# Patient Record
Sex: Male | Born: 1960 | Race: White | Hispanic: No | Marital: Married | Smoking: Never smoker
Health system: Southern US, Community
[De-identification: ages and names within clinical notes are randomized; demographics above are authoritative.]

## PROBLEM LIST (undated history)

## (undated) DIAGNOSIS — C61 Malignant neoplasm of prostate: Secondary | ICD-10-CM

## (undated) HISTORY — PX: TONSILLECTOMY: SUR1361

## (undated) HISTORY — PX: PROSTATE BIOPSY: SHX241

---

## 2015-07-28 DIAGNOSIS — M654 Radial styloid tenosynovitis [de Quervain]: Secondary | ICD-10-CM | POA: Diagnosis not present

## 2015-07-28 DIAGNOSIS — Z86018 Personal history of other benign neoplasm: Secondary | ICD-10-CM | POA: Diagnosis not present

## 2015-07-28 DIAGNOSIS — D485 Neoplasm of uncertain behavior of skin: Secondary | ICD-10-CM | POA: Diagnosis not present

## 2015-07-28 DIAGNOSIS — M25532 Pain in left wrist: Secondary | ICD-10-CM | POA: Diagnosis not present

## 2015-07-28 DIAGNOSIS — D225 Melanocytic nevi of trunk: Secondary | ICD-10-CM | POA: Diagnosis not present

## 2015-07-28 DIAGNOSIS — L719 Rosacea, unspecified: Secondary | ICD-10-CM | POA: Diagnosis not present

## 2016-03-07 DIAGNOSIS — E785 Hyperlipidemia, unspecified: Secondary | ICD-10-CM | POA: Diagnosis not present

## 2016-03-07 DIAGNOSIS — Z131 Encounter for screening for diabetes mellitus: Secondary | ICD-10-CM | POA: Diagnosis not present

## 2016-03-07 DIAGNOSIS — Z0000 Encounter for general adult medical examination without abnormal findings: Secondary | ICD-10-CM | POA: Diagnosis not present

## 2017-03-13 DIAGNOSIS — Z0000 Encounter for general adult medical examination without abnormal findings: Secondary | ICD-10-CM | POA: Diagnosis not present

## 2017-03-13 DIAGNOSIS — E785 Hyperlipidemia, unspecified: Secondary | ICD-10-CM | POA: Diagnosis not present

## 2017-03-13 DIAGNOSIS — Z1211 Encounter for screening for malignant neoplasm of colon: Secondary | ICD-10-CM | POA: Diagnosis not present

## 2017-03-13 DIAGNOSIS — R209 Unspecified disturbances of skin sensation: Secondary | ICD-10-CM | POA: Diagnosis not present

## 2017-03-13 DIAGNOSIS — E669 Obesity, unspecified: Secondary | ICD-10-CM | POA: Diagnosis not present

## 2017-04-03 DIAGNOSIS — D171 Benign lipomatous neoplasm of skin and subcutaneous tissue of trunk: Secondary | ICD-10-CM | POA: Diagnosis not present

## 2017-04-10 DIAGNOSIS — N402 Nodular prostate without lower urinary tract symptoms: Secondary | ICD-10-CM | POA: Diagnosis not present

## 2017-04-10 DIAGNOSIS — R972 Elevated prostate specific antigen [PSA]: Secondary | ICD-10-CM | POA: Diagnosis not present

## 2017-04-24 DIAGNOSIS — R972 Elevated prostate specific antigen [PSA]: Secondary | ICD-10-CM | POA: Diagnosis not present

## 2017-04-29 ENCOUNTER — Other Ambulatory Visit: Payer: Self-pay | Admitting: Urology

## 2017-04-29 DIAGNOSIS — C61 Malignant neoplasm of prostate: Secondary | ICD-10-CM

## 2017-05-01 DIAGNOSIS — N402 Nodular prostate without lower urinary tract symptoms: Secondary | ICD-10-CM | POA: Diagnosis not present

## 2017-05-01 DIAGNOSIS — R972 Elevated prostate specific antigen [PSA]: Secondary | ICD-10-CM | POA: Diagnosis not present

## 2017-05-07 DIAGNOSIS — C61 Malignant neoplasm of prostate: Secondary | ICD-10-CM | POA: Diagnosis not present

## 2017-05-08 ENCOUNTER — Encounter: Payer: Self-pay | Admitting: Radiation Oncology

## 2017-05-13 ENCOUNTER — Encounter (HOSPITAL_COMMUNITY)
Admission: RE | Admit: 2017-05-13 | Discharge: 2017-05-13 | Disposition: A | Discharge status: Home / Self Care | Payer: BLUE CROSS/BLUE SHIELD | Source: Ambulatory Visit | Attending: Urology | Admitting: Urology

## 2017-05-13 DIAGNOSIS — C61 Malignant neoplasm of prostate: Secondary | ICD-10-CM | POA: Insufficient documentation

## 2017-05-13 MED ORDER — TECHNETIUM TC 99M MEDRONATE IV KIT
22.0000 | PACK | Freq: Once | INTRAVENOUS | Status: AC | PRN
  Administered 2017-05-13: 22 via INTRAVENOUS

## 2017-05-20 ENCOUNTER — Ambulatory Visit: Payer: BLUE CROSS/BLUE SHIELD

## 2017-05-20 ENCOUNTER — Ambulatory Visit: Payer: BLUE CROSS/BLUE SHIELD | Admitting: Radiation Oncology

## 2017-05-22 ENCOUNTER — Encounter: Payer: Self-pay | Admitting: Radiation Oncology

## 2017-05-22 NOTE — Progress Notes (Signed)
GU Location of Tumor / Histology: prostatic adenocarcinoma  If Prostate Cancer, Gleason Score is (4 + 3) and PSA is (44.8). Prostate volume: 32 cc.  Barry Nash was referred by Dr. Sheryn Bison (PCP, [facility]) to Dr. Jeffie Pollock for further evaluation of an elevated PSA. Dr. Jeffie Pollock referred the patient to Dr. Alinda Money to discuss surgical options.  Biopsies of prostate (if applicable) revealed:    Past/Anticipated interventions by urology, if any: prostate biopsy, CT scan of abd/pelvis (negative), bone scan (Focal increased radiotracer uptake posterior aspect right eighth rib, right aspect L3 vertebra and tiny area of increased uptake right femoral neck. No comparison exams to determine if these areas represent changes related to degenerative disease/prior fracture.Small metastatic foci therefore cannot be excluded.), referral to radiation oncology  Past/Anticipated interventions by medical oncology, if any: no  Weight changes, if any: no  Bowel/Bladder complaints, if any:  IPSS 1. Denies dysuria, hematuria, leakage or incontinence.   Nausea/Vomiting, if any: no  Pain issues, if any:  no  SAFETY ISSUES:  Prior radiation? no  Pacemaker/ICD? no  Possible current pregnancy? no  Is the patient on methotrexate? no  Current Complaints / other details:  57 year old male. Married with one daughter and one son.

## 2017-05-23 ENCOUNTER — Encounter: Payer: Self-pay | Admitting: Radiation Oncology

## 2017-05-23 ENCOUNTER — Other Ambulatory Visit: Payer: Self-pay

## 2017-05-23 ENCOUNTER — Ambulatory Visit
Admission: RE | Admit: 2017-05-23 | Discharge: 2017-05-23 | Disposition: A | Discharge status: Home / Self Care | Payer: BLUE CROSS/BLUE SHIELD | Source: Ambulatory Visit | Attending: Radiation Oncology | Admitting: Radiation Oncology

## 2017-05-23 ENCOUNTER — Other Ambulatory Visit: Payer: Self-pay | Admitting: Urology

## 2017-05-23 ENCOUNTER — Encounter: Payer: Self-pay | Admitting: Medical Oncology

## 2017-05-23 VITALS — BP 119/84 | HR 75 | Temp 98.0°F | Resp 18 | Ht 73.0 in | Wt 227.6 lb

## 2017-05-23 DIAGNOSIS — Z8042 Family history of malignant neoplasm of prostate: Secondary | ICD-10-CM | POA: Diagnosis not present

## 2017-05-23 DIAGNOSIS — R972 Elevated prostate specific antigen [PSA]: Secondary | ICD-10-CM | POA: Diagnosis not present

## 2017-05-23 DIAGNOSIS — C61 Malignant neoplasm of prostate: Secondary | ICD-10-CM | POA: Diagnosis not present

## 2017-05-23 HISTORY — DX: Malignant neoplasm of prostate: C61

## 2017-05-23 NOTE — Progress Notes (Signed)
See progress note under physician encounter. 

## 2017-05-23 NOTE — Progress Notes (Signed)
Introduced myself to Barry Nash and his wife as the prostate nurse navigator and my role. After his consult with Dr. Alinda Money, he learned he may need multimodality therapy including surgery and radiation. They both are very anxious and state he has always been healthy. This came as a huge shock. I gave them my business card and asked them to call me with questions or concerns. They voiced understanding.

## 2017-05-23 NOTE — Progress Notes (Signed)
Radiation Oncology         [phone] ________________________________  Initial Outpatient Consultation  Name: Barry Nash MRN: [id number]  Date: 05/23/2017  DOB: March 19, 1961  CC:Barry Dials, MD  Barry Bring, MD   REFERRING PHYSICIAN: Raynelle Bring, MD  DIAGNOSIS: 57 y.o. gentleman with Stage T2a adenocarcinoma of the prostate with Gleason Score of 4+3, and PSA of 44.8.    ICD-10-CM   1. Malignant neoplasm of prostate ([place]) C61     HISTORY OF PRESENT ILLNESS: Barry Nash is a 57 y.o. male with a diagnosis of prostate cancer. He was noted to have an elevated PSA of 39.8 by his primary care physician, Dr. Sheryn Nash.  Accordingly, he was referred for evaluation in urology by Dr. Jeffie Nash in January 2019 and a digital rectal examination was performed at that time revealing a 2 cm right base nodule. A repeat PSA on 03/29/17 was measured to be 44.8. The patient proceeded to transrectal ultrasound with 12 biopsies of the prostate on 04/24/17. The prostate volume measured 32 cc.  Out of 12 core biopsies, 9 were positive.  The maximum Gleason score was 4+3, and this was seen in all six cores on the the right.  Additionally, there was Gleason 3+3 disease in 3 of the 6 cores on the left.  He had a CT pelvis and bone scan for disease staging.  CT pelvis on 05/01/2017 was negative for any obvious metastatic disease.  Whole body bone scan on 05/13/17 showed focal increased radiotracer uptake to the posterior aspect of the right eighth rib, right aspect L3 vertebra, and a tiny area of increased uptake to the right femoral neck- non-specific and suspected to be degenerative.    The patient reviewed the biopsy results with his urologist. He has kindly been referred today for discussion of potential radiation treatment options. He met with Dr. Alinda Nash on 05/07/2017 to discuss robot-assisted laparoscopic prostatectomy.  At this time, he is leaning towards proceeding with prostatectomy.  He is accompanied  today by his wife.  PREVIOUS RADIATION THERAPY: No  PAST MEDICAL HISTORY:  Past Medical History:  Diagnosis Date  . Prostate cancer ([place])       PAST SURGICAL HISTORY: Past Surgical History:  Procedure Laterality Date  . PROSTATE BIOPSY    . TONSILLECTOMY      FAMILY HISTORY:  Family History  Problem Relation Age of Onset  . Diabetes Father   . Heart disease Father   . Benign prostatic hyperplasia Father   . Cancer Paternal Grandfather        prostate cancer    SOCIAL HISTORY:  Social History   Socioeconomic History  . Marital status: Married    Spouse name: Not on file  . Number of children: Not on file  . Years of education: Not on file  . Highest education level: Not on file  Social Needs  . Financial resource strain: Not on file  . Food insecurity - worry: Not on file  . Food insecurity - inability: Not on file  . Transportation needs - medical: Not on file  . Transportation needs - non-medical: Not on file  Occupational History  . Not on file  Tobacco Use  . Smoking status: Never Smoker  . Smokeless tobacco: Never Used  Substance and Sexual Activity  . Alcohol use: Yes    Frequency: Never    Comment: occasionally  . Drug use: No  . Sexual activity: Yes  Other Topics Concern  . Not on file  Social  History Narrative   Resides in [place]. Two children: son (30) and daughter (74).     ALLERGIES: Penicillins  MEDICATIONS:  Current Outpatient Medications  Medication Sig Dispense Refill  . Cholecalciferol (VITAMIN D3 PO) Take by mouth. Reports taking 4 2000 IU of D3 per day for total of 8000    . Multiple Vitamins-Minerals (WHOLE FOOD MULTIVITAMIN PO) Take by mouth.    . NON FORMULARY Circumin    . Omega-3 Fatty Acids (FISH OIL) 1000 MG CAPS Take by mouth.    . Probiotic Product (PROBIOTIC-10 PO) Take by mouth.     No current facility-administered medications for this encounter.     REVIEW OF SYSTEMS:  On review of systems, the patient reports  that he is doing well overall. He denies any chest pain, shortness of breath, cough, fevers, chills, night sweats, unintended weight changes. He denies any bowel disturbances, and denies abdominal pain, nausea or vomiting. He denies any new musculoskeletal or joint aches or pains. His IPSS was 1, indicating mild urinary symptoms. He is able to complete sexual activity with most attempts. A complete review of systems is obtained and is otherwise negative.  PHYSICAL EXAM:  Wt Readings from Last 3 Encounters:  05/23/17 227 lb 9.6 oz (103.2 kg)   Temp Readings from Last 3 Encounters:  05/23/17 98 F (36.7 C) (Oral)   BP Readings from Last 3 Encounters:  05/23/17 119/84   Pulse Readings from Last 3 Encounters:  05/23/17 75   Pain Assessment Pain Score: 0-No pain/10  In general this is a well appearing caucasian gentleman in no acute distress. He is alert and oriented x4 and appropriate throughout the examination. HEENT reveals that the patient is normocephalic, atraumatic. EOMs are intact. PERRLA. Skin is intact without any evidence of gross lesions. Cardiovascular exam reveals a regular rate and rhythm, no clicks rubs or murmurs are auscultated. Chest is clear to auscultation bilaterally. Lymphatic assessment is performed and does not reveal any adenopathy in the cervical, supraclavicular, axillary, or inguinal chains. Abdomen has active bowel sounds in all quadrants and is intact. The abdomen is soft, non tender, non distended. Lower extremities are negative for pretibial pitting edema, deep calf tenderness, cyanosis or clubbing.   KPS = 100  100 - Normal; no complaints; no evidence of disease. 90   - Able to carry on normal activity; minor signs or symptoms of disease. 80   - Normal activity with effort; some signs or symptoms of disease. 24   - Cares for self; unable to carry on normal activity or to do active work. 60   - Requires occasional assistance, but is able to care for most of his  personal needs. 50   - Requires considerable assistance and frequent medical care. 40   - Disabled; requires special care and assistance. 65   - Severely disabled; hospital admission is indicated although death not imminent. 23   - Very sick; hospital admission necessary; active supportive treatment necessary. 10   - Moribund; fatal processes progressing rapidly. 0     - Dead  Karnofsky DA, Abelmann WH, Craver LS and Burchenal JH [phone]) The use of the nitrogen mustards in the palliative treatment of carcinoma: with particular reference to bronchogenic carcinoma Cancer 1 [id number]  LABORATORY DATA:  No results found for: WBC, HGB, HCT, MCV, PLT No results found for: NA, K, CL, CO2 No results found for: ALT, AST, GGT, ALKPHOS, BILITOT   RADIOGRAPHY: Nm Bone Scan Whole Body  Result Date: 05/14/2017  CLINICAL DATA:  57 year old male with prostate cancer with elevated PSA. No history of fracture or recent trauma. Initial encounter. EXAM: NUCLEAR MEDICINE WHOLE BODY BONE SCAN TECHNIQUE: Whole body anterior and posterior images were obtained approximately 3 hours after intravenous injection of radiopharmaceutical. RADIOPHARMACEUTICALS:  22.0 mCi Technetium-66mMDP IV COMPARISON:  None. FINDINGS: Focal increased radiotracer uptake posterior aspect right eighth rib. Increased uptake right aspect L3. Tiny area of increased uptake right femoral neck. No comparison exams to determine if these areas represent changes related to degenerative disease/prior fracture. Small metastatic foci cannot be excluded. Slight asymmetric radiotracer uptake superolateral aspect of the orbital region greater on the left possibly related to slight head rotation. Slight increased uptake anterior aspect left femoral neck region may represent overlying structures. Increased uptake patellar region, feet, shoulders and sternoclavicular region bilaterally region probably related to degenerative changes. Linear increased uptake within the  tibia bilaterally can be seen with hypertrophic osteoarthropathy. Both kidneys visualized. IMPRESSION: Focal increased radiotracer uptake posterior aspect right eighth rib, right aspect L3 vertebra and tiny area of increased uptake right femoral neck. No comparison exams to determine if these areas represent changes related to degenerative disease/prior fracture. Small metastatic foci therefore cannot be excluded. Linear increased uptake of the tibia bilaterally as can be seen with hypertrophic osteoarthropathy. Electronically Signed   By: SGenia DelM.D.   On: 05/14/2017 07:36      IMPRESSION/PLAN: 1. 57y.o. gentleman with Stage T2a adenocarcinoma of the prostate with Gleason Score of 4+3, and PSA of 44.8.  Today, we reviewed the findings and workup thus far.  We discussed the natural history of prostate cancer.  We reviewed the the implications of T-stage, Gleason's Score, and PSA on decision-making and outcomes in prostate cancer.  We discussed radiation treatment in the management of prostate cancer with regard to the logistics and delivery of external beam radiation treatment as well as the logistics and delivery of prostate brachytherapy.  We compared and contrasted each of these approaches and also compared these against prostatectomy.  Given his significantly elevated PSA, placing him in the high risk disease category, if he chose to proceed with definitive radiotherapy, our recommendation would be for a combination of long-term ADT, beginning approximately 2 months prior to radiotherapy. He would be a candidate for either 8 weeks of external beam radiotherapy or 5 weeks of external beam radiotherapy with brachytherapy seed boost.  He has met with Dr. BAlinda Moneyto discuss robot-assisted laparoscopic radical prostatectomy and at this point, he is leaning towards proceeding with surgery.  We agree that he appears to be a good surgical candidate and that this would be a good treatment option for him.  We  discussed the potential role of radiotherapy in the post-operative setting should he have negative pathology findings that would suggest an increased risk of residual disease or recurrence or a detectable/rising PSA post prostatectomy.  He seems to have a good understanding of his disease and our recommendations.  At the conclusion of our conversation, the patient would like to proceed with prosatectomy.  We will share our findings with Dr. BAlinda Moneyand Dr. WJeffie Pollockand the patient will move forward with scheduling surgery.  We would be happy to continue to participate in his care should this be indicated in the future.  We enjoyed meeting with him and his wife today.  We spent 60 minutes minutes face to face with the patient and more than 50% of that time was spent in counseling and/or coordination of  care.   Nicholos Johns, PA-C    Tyler Pita, MD  [facility] Direct Dial: [phone]  Fax: [phone] [email or web address]  Skype  [email or web address]  This document serves as a record of services personally performed by Tyler Pita, MD and Freeman Caldron, PA-C. It was created on their behalf by Bethann Humble, a trained medical scribe. The creation of this record is based on the scribe's personal observations and the provider's statements to them. This document has been checked and approved by the attending provider.

## 2017-05-27 DIAGNOSIS — M6281 Muscle weakness (generalized): Secondary | ICD-10-CM | POA: Diagnosis not present

## 2017-05-27 DIAGNOSIS — C61 Malignant neoplasm of prostate: Secondary | ICD-10-CM | POA: Diagnosis not present

## 2017-06-07 DIAGNOSIS — M6281 Muscle weakness (generalized): Secondary | ICD-10-CM | POA: Diagnosis not present

## 2017-06-07 DIAGNOSIS — M62838 Other muscle spasm: Secondary | ICD-10-CM | POA: Diagnosis not present

## 2017-06-07 DIAGNOSIS — N393 Stress incontinence (female) (male): Secondary | ICD-10-CM | POA: Diagnosis not present

## 2017-06-14 NOTE — Patient Instructions (Addendum)
Barry Nash  06/14/2017   Your procedure is scheduled on: 06-24-17    Report to [facility] Main  Entrance at 530 AM. Have a seat in the Main Lobby. Please note there is a phone at the [employer]. Please call [phone] on that phone. Someone from Short Stay will come and get you from the Main Lobby and take you to Short Stay.    Call this number if you have problems the morning of surgery [phone]   Remember: Do not eat food or drink liquids :After Midnight.     Take these medicines the morning of surgery with A SIP OF WATER: None                                You may not have any metal on your body including hair pins and              piercings  Do not wear jewelry, lotions, powders or deodorant             Men may shave face and neck.   Do not bring valuables to the hospital. Barry Nash.  Contacts, dentures or bridgework may not be worn into surgery.  Leave suitcase in the car. After surgery it may be brought to your room.                 Please read over the following fact sheets you were given: _____________________________________________________________________         [facility] - Preparing for Surgery Before surgery, you can play an important role.  Because skin is not sterile, your skin needs to be as free of germs as possible.  You can reduce the number of germs on your skin by washing with CHG (chlorahexidine gluconate) soap before surgery.  CHG is an antiseptic cleaner which kills germs and bonds with the skin to continue killing germs even after washing. Please DO NOT use if you have an allergy to CHG or antibacterial soaps.  If your skin becomes reddened/irritated stop using the CHG and inform your nurse when you arrive at Short Stay. Do not shave (including legs and underarms) for at least 48 hours prior to the first CHG shower.  You may shave your face/neck. Please follow  these instructions carefully:  1.  Shower with CHG Soap the night before surgery and the  morning of Surgery.  2.  If you choose to wash your hair, wash your hair first as usual with your  normal  shampoo.  3.  After you shampoo, rinse your hair and body thoroughly to remove the  shampoo.                           4.  Use CHG as you would any other liquid soap.  You can apply chg directly  to the skin and wash                       Gently with a scrungie or clean washcloth.  5.  Apply the CHG Soap to your body ONLY FROM THE NECK DOWN.   Do not use on face/ open  Wound or open sores. Avoid contact with eyes, ears mouth and genitals (private parts).                       Wash face,  Genitals (private parts) with your normal soap.             6.  Wash thoroughly, paying special attention to the area where your surgery  will be performed.  7.  Thoroughly rinse your body with warm water from the neck down.  8.  DO NOT shower/wash with your normal soap after using and rinsing off  the CHG Soap.                9.  Pat yourself dry with a clean towel.            10.  Wear clean pajamas.            11.  Place clean sheets on your bed the night of your first shower and do not  sleep with pets. Day of Surgery : Do not apply any lotions/deodorants the morning of surgery.  Please wear clean clothes to the hospital/surgery center.  FAILURE TO FOLLOW THESE INSTRUCTIONS MAY RESULT IN THE CANCELLATION OF YOUR SURGERY PATIENT SIGNATURE_________________________________  NURSE SIGNATURE__________________________________  ________________________________________________________________________   Barry Nash  An incentive spirometer is a tool that can help keep your lungs clear and active. This tool measures how well you are filling your lungs with each breath. Taking long deep breaths may help reverse or decrease the chance of developing breathing (pulmonary) problems  (especially infection) following:  A long period of time when you are unable to move or be active. BEFORE THE PROCEDURE   If the spirometer includes an indicator to show your best effort, your nurse or respiratory therapist will set it to a desired goal.  If possible, sit up straight or lean slightly forward. Try not to slouch.  Hold the incentive spirometer in an upright position. INSTRUCTIONS FOR USE  1. Sit on the edge of your bed if possible, or sit up as far as you can in bed or on a chair. 2. Hold the incentive spirometer in an upright position. 3. Breathe out normally. 4. Place the mouthpiece in your mouth and seal your lips tightly around it. 5. Breathe in slowly and as deeply as possible, raising the piston or the ball toward the top of the column. 6. Hold your breath for 3-5 seconds or for as long as possible. Allow the piston or ball to fall to the bottom of the column. 7. Remove the mouthpiece from your mouth and breathe out normally. 8. Rest for a few seconds and repeat Steps 1 through 7 at least 10 times every 1-2 hours when you are awake. Take your time and take a few normal breaths between deep breaths. 9. The spirometer may include an indicator to show your best effort. Use the indicator as a goal to work toward during each repetition. 10. After each set of 10 deep breaths, practice coughing to be sure your lungs are clear. If you have an incision (the cut made at the time of surgery), support your incision when coughing by placing a pillow or rolled up towels firmly against it. Once you are able to get out of bed, walk around indoors and cough well. You may stop using the incentive spirometer when instructed by your caregiver.  RISKS AND COMPLICATIONS  Take your time so you do not get  dizzy or light-headed.  If you are in pain, you may need to take or ask for pain medication before doing incentive spirometry. It is harder to take a deep breath if you are having  pain. AFTER USE  Rest and breathe slowly and easily.  It can be helpful to keep track of a log of your progress. Your caregiver can provide you with a simple table to help with this. If you are using the spirometer at home, follow these instructions: [place] IF:   You are having difficultly using the spirometer.  You have trouble using the spirometer as often as instructed.  Your pain medication is not giving enough relief while using the spirometer.  You develop fever of 100.5 F (38.1 C) or higher. SEEK IMMEDIATE MEDICAL CARE IF:   You cough up bloody sputum that had not been present before.  You develop fever of 102 F (38.9 C) or greater.  You develop worsening pain at or near the incision site. MAKE SURE YOU:   Understand these instructions.  Will watch your condition.  Will get help right away if you are not doing well or get worse. Document Released: 07/23/2006 Document Revised: 06/04/2011 Document Reviewed: 09/23/2006 ExitCare Patient Information 2014 ExitCare, [place].   ________________________________________________________________________  WHAT IS A BLOOD TRANSFUSION? Blood Transfusion Information  A transfusion is the replacement of blood or some of its parts. Blood is made up of multiple cells which provide different functions.  Red blood cells carry oxygen and are used for blood loss replacement.  White blood cells fight against infection.  Platelets control bleeding.  Plasma helps clot blood.  Other blood products are available for specialized needs, such as hemophilia or other clotting disorders. BEFORE THE TRANSFUSION  Who gives blood for transfusions?   Healthy volunteers who are fully evaluated to make sure their blood is safe. This is blood bank blood. Transfusion therapy is the safest it has ever been in the practice of medicine. Before blood is taken from a donor, a complete history is taken to make sure that person has no history  of diseases nor engages in risky social behavior (examples are intravenous drug use or sexual activity with multiple partners). The donor's travel history is screened to minimize risk of transmitting infections, such as malaria. The donated blood is tested for signs of infectious diseases, such as HIV and hepatitis. The blood is then tested to be sure it is compatible with you in order to minimize the chance of a transfusion reaction. If you or a relative donates blood, this is often done in anticipation of surgery and is not appropriate for emergency situations. It takes many days to process the donated blood. RISKS AND COMPLICATIONS Although transfusion therapy is very safe and saves many lives, the main dangers of transfusion include:   Getting an infectious disease.  Developing a transfusion reaction. This is an allergic reaction to something in the blood you were given. Every precaution is taken to prevent this. The decision to have a blood transfusion has been considered carefully by your caregiver before blood is given. Blood is not given unless the benefits outweigh the risks. AFTER THE TRANSFUSION  Right after receiving a blood transfusion, you will usually feel much better and more energetic. This is especially true if your red blood cells have gotten low (anemic). The transfusion raises the level of the red blood cells which carry oxygen, and this usually causes an energy increase.  The nurse administering the transfusion will  monitor you carefully for complications. HOME CARE INSTRUCTIONS  No special instructions are needed after a transfusion. You may find your energy is better. Speak with your caregiver about any limitations on activity for underlying diseases you may have. SEEK MEDICAL CARE IF:   Your condition is not improving after your transfusion.  You develop redness or irritation at the intravenous (IV) site. SEEK IMMEDIATE MEDICAL CARE IF:  Any of the following symptoms  occur over the next 12 hours:  Shaking chills.  You have a temperature by mouth above 102 F (38.9 C), not controlled by medicine.  Chest, back, or muscle pain.  People around you feel you are not acting correctly or are confused.  Shortness of breath or difficulty breathing.  Dizziness and fainting.  You get a rash or develop hives.  You have a decrease in urine output.  Your urine turns a dark color or changes to pink, red, or brown. Any of the following symptoms occur over the next 10 days:  You have a temperature by mouth above 102 F (38.9 C), not controlled by medicine.  Shortness of breath.  Weakness after normal activity.  The white part of the eye turns yellow (jaundice).  You have a decrease in the amount of urine or are urinating less often.  Your urine turns a dark color or changes to pink, red, or brown. Document Released: 03/09/2000 Document Revised: 06/04/2011 Document Reviewed: 10/27/2007 [facility] Patient Information 2014 [place], [place].  _______________________________________________________________________

## 2017-06-17 ENCOUNTER — Encounter (HOSPITAL_COMMUNITY): Payer: Self-pay

## 2017-06-17 ENCOUNTER — Encounter (HOSPITAL_COMMUNITY)
Admission: RE | Admit: 2017-06-17 | Discharge: 2017-06-17 | Disposition: A | Discharge status: Home / Self Care | Payer: BLUE CROSS/BLUE SHIELD | Source: Ambulatory Visit | Attending: Urology | Admitting: Urology

## 2017-06-17 ENCOUNTER — Other Ambulatory Visit: Payer: Self-pay

## 2017-06-17 DIAGNOSIS — C61 Malignant neoplasm of prostate: Secondary | ICD-10-CM | POA: Diagnosis not present

## 2017-06-17 DIAGNOSIS — Z01812 Encounter for preprocedural laboratory examination: Secondary | ICD-10-CM | POA: Diagnosis not present

## 2017-06-17 LAB — BASIC METABOLIC PANEL
ANION GAP: 9 (ref 5–15)
BUN: 16 mg/dL (ref 6–20)
CALCIUM: 8.8 mg/dL — AB (ref 8.9–10.3)
CHLORIDE: 106 mmol/L (ref 101–111)
CO2: 25 mmol/L (ref 22–32)
Creatinine, Ser: 0.93 mg/dL (ref 0.61–1.24)
GFR calc Af Amer: >60 mL/min (ref >60)
GFR calc non Af Amer: >60 mL/min (ref >60)
Glucose, Bld: 88 mg/dL (ref 65–99)
POTASSIUM: 4.4 mmol/L (ref 3.5–5.1)
Sodium: 140 mmol/L (ref 135–145)

## 2017-06-17 LAB — CBC
HEMATOCRIT: 44.8 % (ref 39.0–52.0)
HEMOGLOBIN: 14.7 g/dL (ref 13.0–17.0)
MCH: 27 pg (ref 26.0–34.0)
MCHC: 32.8 g/dL (ref 30.0–36.0)
MCV: 82.2 fL (ref 78.0–100.0)
Platelets: 261 10*3/uL (ref 150–400)
RBC: 5.45 MIL/uL (ref 4.22–5.81)
RDW: 13.6 % (ref 11.5–15.5)
WBC: 6.1 10*3/uL (ref 4.0–10.5)

## 2017-06-17 LAB — ABO/RH: ABO/RH(D): O NEG

## 2017-06-21 NOTE — H&P (Signed)
CC/HPI: CC: Prostate Cancer    Barry Nash is a 57 year old gentleman who was noted to have an elevated PSA of 44.8 and a 2 cm right base prostate nodule prompting a TRUS biopsy of the prostate by Dr. Jeffie Pollock on 04/24/17. This confirmed Gleason 4+3=7 adenocarcinoma of the prostate with 9 out of 12 biopsy cores positive for malignancy.   Family history: None.   Imaging studies:  CT pelvis (05/01/17) - Negative for metastatic disease.   PMH: He has no medical comorbidities.  PSH: No abdominal surgeries.   TNM stage: cT2 vs T3a N0 Mx  PSA: 44.8  Gleason score: 4+3=7  Biopsy (04/24/17): 9/12 cores positive  Left: L lateral mid (20%, 3+3=6), L mid (5%, 3+3=6), L lateral base (5%, 3+3=6)  Right: R apex (80%, 4+3=7), R lateral apex (90%, 4+3=7), R mid (95%, 4+3=7, PNI), R lateral mid (90%, 4+3=7), R base (80%, 4+3=7, PNI), R lateral base (90%, 4+3=7, PNI)  Prostate volume: 32.0 cc   Nomogram  OC disease: 4%  EPE: 95%  SVI: 57%  LNI: 57%  PFS (5 year, 10 year): 12%, 7%   Urinary function: IPSS is 1.  Erectile function: SHIM score is 20. He is able to achieve erections adequate for intercourse without medication.     ALLERGIES: Penicillin - Skin Rash    MEDICATIONS: None   GU PSH: Locm 300-399Mg /Ml Iodine,1Ml - 05/01/2017 Prostate Needle Biopsy - 04/24/2017    NON-GU PSH: Surgical Pathology, Gross And Microscopic Examination For Prostate Needle - 04/24/2017 Tonsillectomy..    GU PMH: Elevated PSA, His right prostate was markedly abnormal and suspicious for a locally advanced prostate cancer. I will notify him of the biopsy results. He will need staging for high risk disease regardless of the grade. I will arrange f/u based on the results. - 04/24/2017, He has a markedly elevated PSA with a large right base nodule and a firm prostate. I am going to repeat a PSA today to assess his PSA kinetics and will get him set up for a biopsy ASAP. I have reviewed the risks of bleeding, infection  and voiding difficulty. Levaquin sent. , - 04/10/2017 Prostate nodule w/o LUTS - 04/10/2017    NON-GU PMH: None   FAMILY HISTORY: 1 Daughter - Other 1 son - Other Diabetes - Father Heart Disease - Father   SOCIAL HISTORY: Marital Status: Married Preferred Language: English; Race: White Current Smoking Status: Patient has never smoked.   Tobacco Use Assessment Completed: Used Tobacco in last 30 days? Drinks 2 caffeinated drinks per day.    REVIEW OF SYSTEMS:    GU Review Male:   Patient denies frequent urination, hard to postpone urination, burning/ pain with urination, get up at night to urinate, leakage of urine, stream starts and stops, trouble starting your streams, and have to strain to urinate .  Gastrointestinal (Upper):   Patient denies nausea and vomiting.  Gastrointestinal (Lower):   Patient denies diarrhea and constipation.  Constitutional:   Patient denies fever, night sweats, weight loss, and fatigue.  Skin:   Patient denies skin rash/ lesion and itching.  Eyes:   Patient denies blurred vision and double vision.  Ears/ Nose/ Throat:   Patient denies sore throat and sinus problems.  Hematologic/Lymphatic:   Patient denies swollen glands and easy bruising.  Cardiovascular:   Patient denies leg swelling and chest pains.  Respiratory:   Patient denies cough and shortness of breath.  Endocrine:   Patient denies excessive thirst.  Musculoskeletal:   Patient denies back pain and joint pain.  Neurological:   Patient denies headaches and dizziness.  Psychologic:   Patient denies depression and anxiety.   VITAL SIGNS:     Weight 221 lb / 100.24 kg  Height 73 in / 185.42 cm  BMI 29.2 kg/m   MULTI-SYSTEM PHYSICAL EXAMINATION:    Constitutional: Well-nourished. No physical deformities. Normally developed. Good grooming.  Neck: Neck symmetrical, not swollen. Normal tracheal position.  Respiratory: No labored breathing, no use of accessory muscles. Clear bilaterally.   Cardiovascular: Normal temperature, normal extremity pulses, no swelling, no varicosities. Regular rate and rhythm.  Lymphatic: No enlargement of neck, axillae, groin.  Skin: No paleness, no jaundice, no cyanosis. No lesion, no ulcer, no rash.  Neurologic / Psychiatric: Oriented to time, oriented to place, oriented to person. No depression, no anxiety, no agitation.  Gastrointestinal: No mass, no tenderness, no rigidity, non obese abdomen.   Eyes: Normal conjunctivae. Normal eyelids.  Ears, Nose, Mouth, and Throat: Left ear no scars, no lesions, no masses. Right ear no scars, no lesions, no masses. Nose no scars, no lesions, no masses. Normal hearing. Normal lips.  Musculoskeletal: Normal gait and station of head and neck.        ASSESSMENT:      ICD-10 Details  1 GU:   Prostate Cancer - C61    PLAN:         1. High risk prostate cancer: He will undergo a unilateral left nerve sparing robot-assisted laparoscopic radical prostatectomy and bilateral pelvic lymphadenectomy. We discussed how this approach can impact his return of erectile function following surgery. He expresses understanding.

## 2017-06-23 NOTE — Anesthesia Preprocedure Evaluation (Addendum)
Anesthesia Evaluation  Patient identified by MRN, date of birth, ID band Patient awake    Reviewed: Allergy & Precautions, NPO status , Patient's Chart, lab work & pertinent test results  Airway Mallampati: II  TM Distance: >3 FB Neck ROM: Full    Dental no notable dental hx. (+) Dental Advisory Given, Chipped   Pulmonary neg pulmonary ROS,    Pulmonary exam normal breath sounds clear to auscultation       Cardiovascular Exercise Tolerance: Good negative cardio ROS Normal cardiovascular exam Rhythm:Regular Rate:Normal     Neuro/Psych negative neurological ROS  negative psych ROS   GI/Hepatic negative GI ROS,   Endo/Other  negative endocrine ROS  Renal/GU negative Renal ROS     Musculoskeletal negative musculoskeletal ROS (+)   Abdominal   Peds  Hematology   Anesthesia Other Findings   Reproductive/Obstetrics                          This SmartLink has not been configured with any valid records.   Lab Results  Component Value Date   CREATININE 0.93 06/17/2017   BUN 16 06/17/2017   NA 140 06/17/2017   K 4.4 06/17/2017   CL 106 06/17/2017   CO2 25 06/17/2017    Lab Results  Component Value Date   WBC 6.1 06/17/2017   HGB 14.7 06/17/2017   HCT 44.8 06/17/2017   MCV 82.2 06/17/2017   PLT 261 06/17/2017    Anesthesia Physical Anesthesia Plan  ASA: II  Anesthesia Plan: General   Post-op Pain Management:    Induction:   PONV Risk Score and Plan: 2 and Treatment may vary due to age or medical condition, Ondansetron, Dexamethasone and Midazolam  Airway Management Planned: Oral ETT  Additional Equipment:   Intra-op Plan:   Post-operative Plan: Extubation in OR  Informed Consent: I have reviewed the patients History and Physical, chart, labs and discussed the procedure including the risks, benefits and alternatives for the proposed anesthesia with the patient or authorized  representative who has indicated his/her understanding and acceptance.   Dental advisory given  Plan Discussed with: CRNA  Anesthesia Plan Comments:         Anesthesia Quick Evaluation

## 2017-06-24 ENCOUNTER — Encounter (HOSPITAL_COMMUNITY): Payer: Self-pay | Admitting: Emergency Medicine

## 2017-06-24 ENCOUNTER — Observation Stay (HOSPITAL_COMMUNITY)
Admission: RE | Admit: 2017-06-24 | Discharge: 2017-06-25 | Disposition: A | Discharge status: Home / Self Care | Payer: BLUE CROSS/BLUE SHIELD | Source: Ambulatory Visit | Attending: Urology | Admitting: Urology

## 2017-06-24 ENCOUNTER — Ambulatory Visit (HOSPITAL_COMMUNITY): Payer: BLUE CROSS/BLUE SHIELD | Admitting: Certified Registered"

## 2017-06-24 ENCOUNTER — Other Ambulatory Visit: Payer: Self-pay

## 2017-06-24 ENCOUNTER — Encounter: Payer: Self-pay | Admitting: Medical Oncology

## 2017-06-24 ENCOUNTER — Encounter (HOSPITAL_COMMUNITY)
Admission: RE | Disposition: A | Discharge status: Home / Self Care | Payer: Self-pay | Source: Ambulatory Visit | Attending: Urology

## 2017-06-24 DIAGNOSIS — C61 Malignant neoplasm of prostate: Principal | ICD-10-CM | POA: Insufficient documentation

## 2017-06-24 HISTORY — PX: LYMPHADENECTOMY: SHX5960

## 2017-06-24 HISTORY — PX: ROBOT ASSISTED LAPAROSCOPIC RADICAL PROSTATECTOMY: SHX5141

## 2017-06-24 LAB — TYPE AND SCREEN
ABO/RH(D): O NEG
Antibody Screen: NEGATIVE

## 2017-06-24 LAB — HEMOGLOBIN AND HEMATOCRIT, BLOOD
HEMATOCRIT: 42.1 % (ref 39.0–52.0)
Hemoglobin: 14.1 g/dL (ref 13.0–17.0)

## 2017-06-24 SURGERY — XI ROBOTIC ASSISTED LAPAROSCOPIC RADICAL PROSTATECTOMY LEVEL 2
Anesthesia: General

## 2017-06-24 MED ORDER — DEXAMETHASONE SODIUM PHOSPHATE 10 MG/ML IJ SOLN
INTRAMUSCULAR | Status: DC | PRN
  Administered 2017-06-24: 10 mg via INTRAVENOUS

## 2017-06-24 MED ORDER — KCL IN DEXTROSE-NACL 20-5-0.45 MEQ/L-%-% IV SOLN
INTRAVENOUS | Status: DC
  Administered 2017-06-24 – 2017-06-25 (×3): via INTRAVENOUS
  Filled 2017-06-24 (×4): qty 1000

## 2017-06-24 MED ORDER — PHENYLEPHRINE HCL 10 MG/ML IJ SOLN
INTRAVENOUS | Status: DC | PRN
  Administered 2017-06-24: 15 ug/min via INTRAVENOUS

## 2017-06-24 MED ORDER — SODIUM CHLORIDE 0.9 % IR SOLN
Status: DC | PRN
  Administered 2017-06-24: 1000 mL via INTRAVESICAL

## 2017-06-24 MED ORDER — SULFAMETHOXAZOLE-TRIMETHOPRIM 800-160 MG PO TABS: 1.0000 | ORAL_TABLET | Freq: Two times a day (BID) | ORAL | 0 refills | Status: DC

## 2017-06-24 MED ORDER — HYDROCODONE-ACETAMINOPHEN 7.5-325 MG PO TABS: 1.0000 | ORAL_TABLET | Freq: Once | ORAL | Status: DC | PRN

## 2017-06-24 MED ORDER — INDIGOTINDISULFONATE SODIUM 8 MG/ML IJ SOLN
INTRAMUSCULAR | Status: AC
  Filled 2017-06-24: qty 5

## 2017-06-24 MED ORDER — MIDAZOLAM HCL 2 MG/2ML IJ SOLN
INTRAMUSCULAR | Status: DC | PRN
  Administered 2017-06-24: 1 mg via INTRAVENOUS

## 2017-06-24 MED ORDER — PHENYLEPHRINE HCL-NACL 10-0.9 MG/250ML-% IV SOLN
INTRAVENOUS | Status: AC
  Filled 2017-06-24: qty 250

## 2017-06-24 MED ORDER — SODIUM CHLORIDE 0.9 % IV BOLUS
1000.0000 mL | Freq: Once | INTRAVENOUS | Status: AC
  Administered 2017-06-24: 1000 mL via INTRAVENOUS

## 2017-06-24 MED ORDER — DIPHENHYDRAMINE HCL 12.5 MG/5ML PO ELIX: 12.5000 mg | ORAL_SOLUTION | Freq: Four times a day (QID) | ORAL | Status: DC | PRN

## 2017-06-24 MED ORDER — KETOROLAC TROMETHAMINE 15 MG/ML IJ SOLN
15.0000 mg | Freq: Four times a day (QID) | INTRAMUSCULAR | Status: DC
  Administered 2017-06-24 – 2017-06-25 (×5): 15 mg via INTRAVENOUS
  Filled 2017-06-24 (×5): qty 1

## 2017-06-24 MED ORDER — SUGAMMADEX SODIUM 200 MG/2ML IV SOLN
INTRAVENOUS | Status: DC | PRN
  Administered 2017-06-24: 200 mg via INTRAVENOUS

## 2017-06-24 MED ORDER — PROPOFOL 10 MG/ML IV BOLUS
INTRAVENOUS | Status: AC
  Filled 2017-06-24: qty 20

## 2017-06-24 MED ORDER — FENTANYL CITRATE (PF) 250 MCG/5ML IJ SOLN
INTRAMUSCULAR | Status: AC
  Filled 2017-06-24: qty 5

## 2017-06-24 MED ORDER — LIDOCAINE 2% (20 MG/ML) 5 ML SYRINGE
INTRAMUSCULAR | Status: DC | PRN
  Administered 2017-06-24: 100 mg via INTRAVENOUS

## 2017-06-24 MED ORDER — MAGNESIUM CITRATE PO SOLN
1.0000 | Freq: Once | ORAL | Status: DC
  Filled 2017-06-24: qty 296

## 2017-06-24 MED ORDER — HYDROCODONE-ACETAMINOPHEN 5-325 MG PO TABS: 1.0000 | ORAL_TABLET | Freq: Four times a day (QID) | ORAL | 0 refills | Status: DC | PRN

## 2017-06-24 MED ORDER — CEFAZOLIN SODIUM-DEXTROSE 1-4 GM/50ML-% IV SOLN
1.0000 g | Freq: Three times a day (TID) | INTRAVENOUS | Status: AC
  Administered 2017-06-24 (×2): 1 g via INTRAVENOUS
  Filled 2017-06-24 (×2): qty 50

## 2017-06-24 MED ORDER — FLEET ENEMA 7-19 GM/118ML RE ENEM
1.0000 | ENEMA | Freq: Once | RECTAL | Status: DC
Start: 2017-06-24 — End: 2017-06-24
  Filled 2017-06-24: qty 1

## 2017-06-24 MED ORDER — FENTANYL CITRATE (PF) 250 MCG/5ML IJ SOLN
INTRAMUSCULAR | Status: DC | PRN
  Administered 2017-06-24 (×2): 50 ug via INTRAVENOUS
  Administered 2017-06-24: 25 ug via INTRAVENOUS
  Administered 2017-06-24 (×2): 50 ug via INTRAVENOUS
  Administered 2017-06-24: 25 ug via INTRAVENOUS

## 2017-06-24 MED ORDER — HYDROMORPHONE HCL 1 MG/ML IJ SOLN: 0.2500 mg | INTRAMUSCULAR | Status: DC | PRN

## 2017-06-24 MED ORDER — MEPERIDINE HCL 50 MG/ML IJ SOLN: 6.2500 mg | INTRAMUSCULAR | Status: DC | PRN

## 2017-06-24 MED ORDER — DIPHENHYDRAMINE HCL 50 MG/ML IJ SOLN: 12.5000 mg | Freq: Four times a day (QID) | INTRAMUSCULAR | Status: DC | PRN

## 2017-06-24 MED ORDER — DOCUSATE SODIUM 100 MG PO CAPS
100.0000 mg | ORAL_CAPSULE | Freq: Two times a day (BID) | ORAL | Status: DC
  Administered 2017-06-24 – 2017-06-25 (×2): 100 mg via ORAL
  Filled 2017-06-24 (×2): qty 1

## 2017-06-24 MED ORDER — ACETAMINOPHEN 325 MG PO TABS: 650.0000 mg | ORAL_TABLET | ORAL | Status: DC | PRN

## 2017-06-24 MED ORDER — BUPIVACAINE HCL (PF) 0.25 % IJ SOLN
INTRAMUSCULAR | Status: AC
  Filled 2017-06-24: qty 30

## 2017-06-24 MED ORDER — PROPOFOL 10 MG/ML IV BOLUS
INTRAVENOUS | Status: DC | PRN
  Administered 2017-06-24: 180 mg via INTRAVENOUS

## 2017-06-24 MED ORDER — LACTATED RINGERS IV SOLN
INTRAVENOUS | Status: DC | PRN
  Administered 2017-06-24: 1000 mL

## 2017-06-24 MED ORDER — INDIGOTINDISULFONATE SODIUM 8 MG/ML IJ SOLN
INTRAMUSCULAR | Status: DC | PRN
  Administered 2017-06-24: 5 mL via INTRAVENOUS

## 2017-06-24 MED ORDER — MIDAZOLAM HCL 2 MG/2ML IJ SOLN
INTRAMUSCULAR | Status: AC
  Filled 2017-06-24: qty 2

## 2017-06-24 MED ORDER — BUPIVACAINE HCL (PF) 0.25 % IJ SOLN
INTRAMUSCULAR | Status: DC | PRN
  Administered 2017-06-24: 30 mL

## 2017-06-24 MED ORDER — SUCCINYLCHOLINE CHLORIDE 200 MG/10ML IV SOSY
PREFILLED_SYRINGE | INTRAVENOUS | Status: DC | PRN
  Administered 2017-06-24: 100 mg via INTRAVENOUS

## 2017-06-24 MED ORDER — HEPARIN SODIUM (PORCINE) 1000 UNIT/ML IJ SOLN
INTRAMUSCULAR | Status: AC
  Filled 2017-06-24: qty 1

## 2017-06-24 MED ORDER — ACETAMINOPHEN 10 MG/ML IV SOLN: 1000.0000 mg | Freq: Once | INTRAVENOUS | Status: DC | PRN

## 2017-06-24 MED ORDER — ONDANSETRON HCL 4 MG/2ML IJ SOLN
INTRAMUSCULAR | Status: DC | PRN
  Administered 2017-06-24 (×2): 4 mg via INTRAVENOUS

## 2017-06-24 MED ORDER — LACTATED RINGERS IV SOLN
INTRAVENOUS | Status: DC | PRN
  Administered 2017-06-24: 07:00:00 via INTRAVENOUS

## 2017-06-24 MED ORDER — PROMETHAZINE HCL 25 MG/ML IJ SOLN: 6.2500 mg | INTRAMUSCULAR | Status: DC | PRN

## 2017-06-24 MED ORDER — MORPHINE SULFATE (PF) 4 MG/ML IV SOLN
2.0000 mg | INTRAVENOUS | Status: DC | PRN
  Administered 2017-06-24: 2 mg via INTRAVENOUS
  Filled 2017-06-24: qty 1

## 2017-06-24 MED ORDER — ROCURONIUM BROMIDE 10 MG/ML (PF) SYRINGE
PREFILLED_SYRINGE | INTRAVENOUS | Status: DC | PRN
  Administered 2017-06-24: 20 mg via INTRAVENOUS
  Administered 2017-06-24 (×2): 10 mg via INTRAVENOUS
  Administered 2017-06-24: 50 mg via INTRAVENOUS
  Administered 2017-06-24: 20 mg via INTRAVENOUS
  Administered 2017-06-24: 5 mg via INTRAVENOUS
  Administered 2017-06-24: 10 mg via INTRAVENOUS

## 2017-06-24 MED ORDER — CEFAZOLIN SODIUM-DEXTROSE 2-4 GM/100ML-% IV SOLN
2.0000 g | Freq: Once | INTRAVENOUS | Status: AC
  Administered 2017-06-24: 2 g via INTRAVENOUS
  Filled 2017-06-24: qty 100

## 2017-06-24 SURGICAL SUPPLY — 53 items
APPLICATOR COTTON TIP 6IN STRL (MISCELLANEOUS) ×4 IMPLANT
CATH FOLEY 2WAY SLVR 18FR 30CC (CATHETERS) ×4 IMPLANT
CATH ROBINSON RED A/P 16FR (CATHETERS) ×4 IMPLANT
CATH ROBINSON RED A/P 8FR (CATHETERS) ×4 IMPLANT
CATH TIEMANN FOLEY 18FR 5CC (CATHETERS) ×4 IMPLANT
CHLORAPREP W/TINT 26ML (MISCELLANEOUS) ×4 IMPLANT
CLIP VESOLOCK LG 6/CT PURPLE (CLIP) ×12 IMPLANT
COVER SURGICAL LIGHT HANDLE (MISCELLANEOUS) ×4 IMPLANT
COVER TIP SHEARS 8 DVNC (MISCELLANEOUS) ×2 IMPLANT
COVER TIP SHEARS 8MM DA VINCI (MISCELLANEOUS) ×2
CUTTER ECHEON FLEX ENDO 45 340 (ENDOMECHANICALS) ×4 IMPLANT
DECANTER SPIKE VIAL GLASS SM (MISCELLANEOUS) IMPLANT
DERMABOND ADVANCED (GAUZE/BANDAGES/DRESSINGS) ×2
DERMABOND ADVANCED .7 DNX12 (GAUZE/BANDAGES/DRESSINGS) ×2 IMPLANT
DRAPE ARM DVNC X/XI (DISPOSABLE) ×8 IMPLANT
DRAPE COLUMN DVNC XI (DISPOSABLE) ×2 IMPLANT
DRAPE DA VINCI XI ARM (DISPOSABLE) ×8
DRAPE DA VINCI XI COLUMN (DISPOSABLE) ×2
DRAPE SURG IRRIG POUCH 19X23 (DRAPES) ×4 IMPLANT
DRSG TEGADERM 4X4.75 (GAUZE/BANDAGES/DRESSINGS) ×4 IMPLANT
ELECT REM PT RETURN 15FT ADLT (MISCELLANEOUS) ×4 IMPLANT
GLOVE BIO SURGEON STRL SZ 6.5 (GLOVE) ×3 IMPLANT
GLOVE BIO SURGEONS STRL SZ 6.5 (GLOVE) ×1
GLOVE BIOGEL M STRL SZ7.5 (GLOVE) ×8 IMPLANT
GOWN STRL REUS W/TWL LRG LVL3 (GOWN DISPOSABLE) ×12 IMPLANT
HOLDER FOLEY CATH W/STRAP (MISCELLANEOUS) ×4 IMPLANT
IRRIG SUCT STRYKERFLOW 2 WTIP (MISCELLANEOUS) ×4
IRRIGATION SUCT STRKRFLW 2 WTP (MISCELLANEOUS) ×2 IMPLANT
IV LACTATED RINGERS 1000ML (IV SOLUTION) IMPLANT
NDL SAFETY ECLIPSE 18X1.5 (NEEDLE) ×2 IMPLANT
NEEDLE HYPO 18GX1.5 SHARP (NEEDLE) ×2
PACK ROBOT UROLOGY CUSTOM (CUSTOM PROCEDURE TRAY) ×4 IMPLANT
SEAL CANN UNIV 5-8 DVNC XI (MISCELLANEOUS) ×8 IMPLANT
SEAL XI 5MM-8MM UNIVERSAL (MISCELLANEOUS) ×8
SOLUTION ELECTROLUBE (MISCELLANEOUS) ×4 IMPLANT
STAPLE RELOAD 45 GRN (STAPLE) ×2 IMPLANT
STAPLE RELOAD 45MM GREEN (STAPLE) ×2
SUT ETHILON 3 0 PS 1 (SUTURE) ×4 IMPLANT
SUT MNCRL 3 0 RB1 (SUTURE) ×2 IMPLANT
SUT MNCRL 3 0 VIOLET RB1 (SUTURE) ×2 IMPLANT
SUT MNCRL AB 4-0 PS2 18 (SUTURE) ×8 IMPLANT
SUT MONOCRYL 3 0 RB1 (SUTURE) ×4
SUT VIC AB 0 CT1 27 (SUTURE) ×2
SUT VIC AB 0 CT1 27XBRD ANTBC (SUTURE) ×2 IMPLANT
SUT VIC AB 0 UR5 27 (SUTURE) ×4 IMPLANT
SUT VIC AB 2-0 SH 27 (SUTURE) ×4
SUT VIC AB 2-0 SH 27X BRD (SUTURE) ×4 IMPLANT
SUT VICRYL 0 UR6 27IN ABS (SUTURE) ×8 IMPLANT
SYR 27GX1/2 1ML LL SAFETY (SYRINGE) ×4 IMPLANT
TOWEL OR 17X26 10 PK STRL BLUE (TOWEL DISPOSABLE) IMPLANT
TOWEL OR NON WOVEN STRL DISP B (DISPOSABLE) ×4 IMPLANT
TUBING INSUFFLATION 10FT LAP (TUBING) IMPLANT
WATER STERILE IRR 1000ML POUR (IV SOLUTION) ×4 IMPLANT

## 2017-06-24 NOTE — Discharge Instructions (Signed)

## 2017-06-24 NOTE — Progress Notes (Signed)
Patient was able to ambulate x 1, 90 ft. Pt tolerated activity well. Pt denied any dizziness and or weakness. Pain 5/10. Pt stated that medication is not required at this time. Will continue to monitor

## 2017-06-24 NOTE — Anesthesia Procedure Notes (Signed)
Procedure Name: Intubation Date/Time: 06/24/2017 7:26 AM Performed by: Cynda Familia, CRNA Pre-anesthesia Checklist: Patient identified, Emergency Drugs available, Suction available and Patient being monitored Patient Re-evaluated:Patient Re-evaluated prior to induction Oxygen Delivery Method: Circle System Utilized Preoxygenation: Pre-oxygenation with 100% oxygen Induction Type: IV induction Ventilation: Mask ventilation without difficulty Laryngoscope Size: Miller and 2 Grade View: Grade I Tube type: Oral Number of attempts: 1 Airway Equipment and Method: Stylet Placement Confirmation: ETT inserted through vocal cords under direct vision,  positive ETCO2 and breath sounds checked- equal and bilateral Secured at: 22 cm Tube secured with: Tape Dental Injury: Teeth and Oropharynx as per pre-operative assessment  Comments: Smooth IV induction Houser--- intubation AM CRNA atraumatic-- teeth with irregular surfaces ( front)--- intubation atraumatic-- teeth and mouth as preop--- bilat BS Houser

## 2017-06-24 NOTE — Anesthesia Procedure Notes (Signed)
Date/Time: 06/24/2017 11:18 AM Performed by: Cynda Familia, CRNA Oxygen Delivery Method: Simple face mask Placement Confirmation: positive ETCO2 and breath sounds checked- equal and bilateral Dental Injury: Teeth and Oropharynx as per pre-operative assessment

## 2017-06-24 NOTE — Op Note (Signed)
Preoperative diagnosis: Clinically localized adenocarcinoma of the prostate (clinical stage T3a N0 M0)  Postoperative diagnosis: Clinically localized adenocarcinoma of the prostate (clinical stage T3a N0 M0)  Procedure:  1. Robotic assisted laparoscopic radical prostatectomy (Left unilateral nerve sparing) 2. Bilateral robotic assisted laparoscopic extended pelvic lymphadenectomy  Surgeon: Pryor Curia. M.D.  Assistant(s): Debbrah Alar, PA-c  An assistant was required for this surgical procedure.  The duties of the assistant included but were not limited to suctioning, passing suture, camera manipulation, retraction. This procedure would not be able to be performed without an Environmental consultant.  Resident: Dr. Jeralyn Ruths  Anesthesia: General  Complications: None  EBL: 100 mL  IVF:  1200 mL crystalloid  Specimens: 1. Prostate and seminal vesicles 2. Right pelvic lymph nodes 3. Left pelvic lymph nodes  Disposition of specimens: Pathology  Drains: 1. 20 Fr coude catheter 2. # 19 Blake pelvic drain  Indication: Barry Nash is a 57 y.o. patient with locally advanced, high risk prostate cancer.  After a thorough review of the management options for treatment of prostate cancer, he elected to proceed with surgical therapy and the above procedure(s).  We have discussed the potential benefits and risks of the procedure, side effects of the proposed treatment, the likelihood of the patient achieving the goals of the procedure, and any potential problems that might occur during the procedure or recuperation. Informed consent has been obtained.  Description of procedure:  The patient was taken to the operating room and a general anesthetic was administered. He was given preoperative antibiotics, placed in the dorsal lithotomy position, and prepped and draped in the usual sterile fashion. Next a preoperative timeout was performed. A urethral catheter was placed into the bladder and a  site was selected near the umbilicus for placement of the camera port. This was placed using a standard open Hassan technique which allowed entry into the peritoneal cavity under direct vision and without difficulty. An 8 mm port was placed and a pneumoperitoneum established. The camera was then used to inspect the abdomen and there was no evidence of any intra-abdominal injuries or other abnormalities. The remaining abdominal ports were then placed. 8 mm robotic ports were placed in the right lower quadrant, left lower quadrant, and far left lateral abdominal wall. A 5 mm port was placed in the right upper quadrant and a 12 mm port was placed in the right lateral abdominal wall for laparoscopic assistance. All ports were placed under direct vision without difficulty. The surgical cart was then docked.   Utilizing the cautery scissors, the bladder was reflected posteriorly allowing entry into the space of Retzius and identification of the endopelvic fascia and prostate. The periprostatic fat was then removed from the prostate allowing full exposure of the endopelvic fascia. The endopelvic fascia was then incised from the apex back to the base of the prostate bilaterally and the underlying levator muscle fibers were swept laterally off the prostate thereby isolating the dorsal venous complex. The dorsal vein was then stapled and divided with a 45 mm Flex Echelon stapler. Attention then turned to the bladder neck which was divided anteriorly thereby allowing entry into the bladder and exposure of the urethral catheter. The catheter balloon was deflated and the catheter was brought into the operative field and used to retract the prostate anteriorly. The posterior bladder neck was then examined and was divided allowing further dissection between the bladder and prostate posteriorly until the vasa deferentia and seminal vessels were identified. The vasa deferentia were  isolated, divided, and lifted anteriorly. The  seminal vesicles were dissected down to their tips with care to control the seminal vascular arterial blood supply. These structures were then lifted anteriorly and the space between Denonvillier's fascia and the anterior rectum was developed with a combination of sharp and blunt dissection. This isolated the vascular pedicles of the prostate.  The lateral prostatic fascia on the left side of the prostate was then sharply incised allowing release of the neurovascular bundle. The vascular pedicle of the prostate on the left side was then ligated with Weck clips between the prostate and neurovascular bundle and divided with sharp cold scissor dissection resulting in neurovascular bundle preservation. On the right side, a wide non nerve sparing dissection was performed with Weck clips used to ligate the vascular pedicle of the prostate. The neurovascular bundle on the left side was then separated off the apex of the prostate and urethra.  The urethra was then sharply transected allowing the prostate specimen to be disarticulated. The pelvis was copiously irrigated and hemostasis was ensured. There was no evidence for rectal injury.  Attention then turned to the right pelvic sidewall. The fibrofatty tissue extending from the genitofemoral nerve laterally to the confluence of the iliac vessels proximally to the hypogastric artery posteriorly and Cooper's ligament distally was dissected free from the pelvic sidewall with care to preserve the obturator nerve and major vascular structures. Weck clips were used for lymphostasis and hemostasis. An identical procedure was then performed on the contralateral side and the lymphatic packets were removed for permanent pathologic analysis.  Attention then turned to the urethral anastomosis. A 2-0 Vicryl slip knot was placed between Denonvillier's fascia, the posterior bladder neck, and the posterior urethra to reapproximate these structures.  The bladder neck was extremely  thin and the bladder stitch did pull through the posterior bladder.  Indigo carmine was administered to identify the ureteral orifices.  A double-armed 3-0 Monocryl suture was then used to perform a 360 running tension-free anastomosis between the bladder neck and urethra. The posterior anastamosis was quite tenuous and this part of the anastamosis required very careful tension gradually on the bladder neck as the stitches were run bilaterally on double armed needles. Once complete, a new urethral catheter was then placed into the bladder under direct vision just before the suture was tied and irrigated. There were no blood clots within the bladder and the anastomosis appeared to have a small leak posteriorly.  It was felt that taking down the anastamosis would result in additional difficulties and only make further repair more complicated.  It was felt the current anastamosis should heal well.  A #19 Blake drain was then brought through the left lateral 8 mm port site and positioned appropriately within the pelvis. It was secured to the skin with a nylon suture. The surgical cart was then undocked. The right lateral 12 mm port site was closed at the fascial level with a 0 Vicryl suture placed laparoscopically. All remaining ports were then removed under direct vision. The prostate specimen was removed intact within the Endopouch retrieval bag via the periumbilical camera port site. This fascial opening was closed with two running 0 Vicryl sutures. 0.25% Marcaine was then injected into all port sites and all incisions were reapproximated at the skin level with 4-0 Monocryl subcuticular sutures. Dermabond was applied. The patient appeared to tolerate the procedure well and without complications. The patient was able to be extubated and transferred to the recovery unit in satisfactory condition.  Pryor Curia MD

## 2017-06-24 NOTE — Anesthesia Postprocedure Evaluation (Signed)
Anesthesia Post Note  Patient: Barry Nash  Procedure(s) Performed: XI ROBOTIC ASSISTED LAPAROSCOPIC RADICAL PROSTATECTOMY LEVEL 2 (N/A ) LYMPHADENECTOMY (Bilateral )     Patient location during evaluation: PACU Anesthesia Type: General Level of consciousness: awake and alert Pain management: pain level controlled Vital Signs Assessment: post-procedure vital signs reviewed and stable Respiratory status: spontaneous breathing, nonlabored ventilation, respiratory function stable and patient connected to nasal cannula oxygen Cardiovascular status: blood pressure returned to baseline and stable Postop Assessment: no apparent nausea or vomiting Anesthetic complications: no    Last Vitals:  Vitals:   06/24/17 1200 06/24/17 1215  BP: 116/79 116/68  Pulse: 62 (!) 59  Resp: (!) 21 18  Temp: 37 C 36.8 C  SpO2: 94% 97%    Last Pain:  Vitals:   06/24/17 1200  TempSrc:   PainSc: 3                  Barnet Glasgow

## 2017-06-24 NOTE — Transfer of Care (Signed)
Immediate Anesthesia Transfer of Care Note  Patient: Barry Nash  Procedure(s) Performed: XI ROBOTIC ASSISTED LAPAROSCOPIC RADICAL PROSTATECTOMY LEVEL 2 (N/A ) LYMPHADENECTOMY (Bilateral )  Patient Location: PACU  Anesthesia Type:General  Level of Consciousness: awake and alert   Airway & Oxygen Therapy: Patient Spontanous Breathing and Patient connected to face mask oxygen  Post-op Assessment: Report given to RN and Post -op Vital signs reviewed and stable  Post vital signs: Reviewed and stable  Last Vitals:  Vitals Value Taken Time  BP 130/75 06/24/2017 11:25 AM  Temp    Pulse 69 06/24/2017 11:26 AM  Resp    SpO2 93 % 06/24/2017 11:26 AM  Vitals shown include unvalidated device data.  Last Pain:  Vitals:   06/24/17 0605  TempSrc:   PainSc: 0-No pain      Patients Stated Pain Goal: 4 (47/82/95 [id number])  Complications: No apparent anesthesia complications

## 2017-06-24 NOTE — Progress Notes (Signed)
Visited with [place] while patient in surgery. She is quite anxious and has questions. We discussed what to expect post up and urinary rehab. He will follow up with Dr. Alinda Money in a week for catheter removal and to discuss the pathology. She has my card and I asked her to call me with questions or concerns. I will visit patient in am before discharge.

## 2017-06-24 NOTE — Progress Notes (Signed)
Patient ambulated on hall way at this time, tolerated well. 

## 2017-06-25 ENCOUNTER — Encounter (HOSPITAL_COMMUNITY): Payer: Self-pay | Admitting: Urology

## 2017-06-25 ENCOUNTER — Encounter: Payer: Self-pay | Admitting: Medical Oncology

## 2017-06-25 DIAGNOSIS — C61 Malignant neoplasm of prostate: Secondary | ICD-10-CM | POA: Diagnosis not present

## 2017-06-25 LAB — CREATININE, FLUID (PLEURAL, PERITONEAL, JP DRAINAGE): CREAT FL: 1.2 mg/dL

## 2017-06-25 LAB — GLUCOSE, CAPILLARY: GLUCOSE-CAPILLARY: 116 mg/dL — AB (ref 65–99)

## 2017-06-25 LAB — HEMOGLOBIN AND HEMATOCRIT, BLOOD
HCT: 40.2 % (ref 39.0–52.0)
Hemoglobin: 13.3 g/dL (ref 13.0–17.0)

## 2017-06-25 MED ORDER — HYDROCODONE-ACETAMINOPHEN 5-325 MG PO TABS
1.0000 | ORAL_TABLET | Freq: Four times a day (QID) | ORAL | Status: DC | PRN
  Administered 2017-06-25: 1 via ORAL
  Filled 2017-06-25: qty 1

## 2017-06-25 MED ORDER — HYDROCODONE-ACETAMINOPHEN 5-325 MG PO TABS: 1.0000 | ORAL_TABLET | Freq: Four times a day (QID) | ORAL | Status: DC | PRN

## 2017-06-25 MED ORDER — BISACODYL 10 MG RE SUPP
10.0000 mg | Freq: Once | RECTAL | Status: AC
Start: 2017-06-25 — End: 2017-06-25
  Administered 2017-06-25: 10 mg via RECTAL
  Filled 2017-06-25: qty 1

## 2017-06-25 NOTE — Progress Notes (Signed)
Mr. Barry Nash doing well post robotic prostatectomy. We discussed the importance of deep breathing and ambulating after discharge. He has follow up with Dr. Alinda Money in a week to discuss pathology. He states he will need to keep his catheter a few more days due to thin tissue at the bladder neck. I wished him well and asked him to call with questions or concerns.

## 2017-06-25 NOTE — Discharge Summary (Signed)
  Date of admission: 06/24/2017  Date of discharge: 06/25/2017  Admission diagnosis: Prostate Cancer  Discharge diagnosis: Prostate Cancer  History and Physical: For full details, please see admission history and physical. Briefly, Markeese Boyajian is a 57 y.o. gentleman with localized prostate cancer.  After discussing management/treatment options, he elected to proceed with surgical treatment.  Hospital Course: Armany Mano was taken to the operating room on 06/24/2017 and underwent a robotic assisted laparoscopic radical prostatectomy. He tolerated this procedure well and without complications. Postoperatively, he was able to be transferred to a regular hospital room following recovery from anesthesia.  He was able to begin ambulating the night of surgery. He remained hemodynamically stable overnight.  He had excellent urine output with appropriately minimal output from his pelvic drain and his pelvic drain was removed on POD #1.  He was transitioned to oral pain medication, tolerated a clear liquid diet, and had met all discharge criteria and was able to be discharged home later on POD#1.  Laboratory values:  Recent Labs    06/24/17 1131 06/25/17 0526  HGB 14.1 13.3  HCT 42.1 40.2    Disposition: Home  Discharge instruction: He was instructed to be ambulatory but to refrain from heavy lifting, strenuous activity, or driving. He was instructed on urethral catheter care.  Discharge medications:   Allergies as of 06/25/2017      Reactions   Penicillins Rash, Other (See Comments)   Has patient had a PCN reaction causing immediate rash, facial/tongue/throat swelling, SOB or lightheadedness with hypotension: No Has patient had a PCN reaction causing severe rash involving mucus membranes or skin necrosis: No Has patient had a PCN reaction that required hospitalization: Yes Has patient had a PCN reaction occurring within the last 10 years: No If all of the above answers are "NO", then may  proceed with Cephalosporin use.      Medication List    STOP taking these medications   MULTIVITAMIN PO   VITAMIN D3 PO     TAKE these medications   HYDROcodone-acetaminophen 5-325 MG tablet Commonly known as:  NORCO Take 1-2 tablets by mouth every 6 (six) hours as needed for moderate pain or severe pain.   sulfamethoxazole-trimethoprim 800-160 MG tablet Commonly known as:  BACTRIM DS,SEPTRA DS Take 1 tablet by mouth 2 (two) times daily. Start the day prior to foley removal appointment       Followup: He will followup in 1 week for catheter removal and to discuss his surgical pathology results.

## 2017-06-25 NOTE — Progress Notes (Signed)
Patient ID: Barry Nash, male   DOB: Jun 03, 1960, 57 y.o.   MRN: [id number]  1 Day Post-Op Subjective: The patient is doing well.  No nausea or vomiting. Pain is adequately controlled.  UOP excellent overnight with minimal drain output over last shift.  Objective: Vital signs in last 24 hours: Temp:  [98.2 F (36.8 C)-98.9 F (37.2 C)] 98.2 F (36.8 C) (04/02 0558) Pulse Rate:  [59-78] 70 (04/02 0558) Resp:  [16-21] 20 (04/02 0558) BP: (116-133)/(66-79) 124/74 (04/02 0558) SpO2:  [93 %-97 %] 95 % (04/02 0558)  Intake/Output from previous day: 04/01 0701 - 04/02 0700 In: 5400 [I.V.:4350; IV Piggyback:1050] Out: 3530 [Urine:2815; Drains:615; Blood:100] Intake/Output this shift: No intake/output data recorded.  Physical Exam:  General: Alert and oriented. CV: RRR Lungs: Clear bilaterally. GI: Soft, Nondistended. Positive BS. Incisions: Clean, dry, and intact Urine: Clear Extremities: Nontender, no erythema, no edema.  Lab Results: Recent Labs    06/24/17 1131 06/25/17 0526  HGB 14.1 13.3  HCT 42.1 40.2      Assessment/Plan: POD# 1 s/p robotic prostatectomy.  1) SL IVF 2) Ambulate, Incentive spirometry 3) Transition to oral pain medication 4) Dulcolax suppository 5) Will check drain Cr this morning 6) Plan for likely discharge later today if drain Cr normal   Roxy Horseman, Brooke Bonito. MD   LOS: 0 days   Wilman Tucker,LES 06/25/2017, 7:49 AM

## 2017-06-27 ENCOUNTER — Telehealth: Payer: Self-pay | Admitting: Medical Oncology

## 2017-06-27 NOTE — Telephone Encounter (Signed)
Barbara-wife called asking about getting a new leg strap for his foley bag because he got it wet in the shower. She also states there is not a strap for his leg bag. I explained that he can take the strap off when he showers and put it back on afterwards. I suggested going to medical supply if they need an extra. She looked in the leg bag package and she did find leg strap. She was confused because the one they showed in the class was red. She thanked me for returning call.

## 2017-07-05 DIAGNOSIS — C61 Malignant neoplasm of prostate: Secondary | ICD-10-CM | POA: Diagnosis not present

## 2017-07-22 DIAGNOSIS — M6281 Muscle weakness (generalized): Secondary | ICD-10-CM | POA: Diagnosis not present

## 2017-07-22 DIAGNOSIS — N393 Stress incontinence (female) (male): Secondary | ICD-10-CM | POA: Diagnosis not present

## 2017-07-22 DIAGNOSIS — M62838 Other muscle spasm: Secondary | ICD-10-CM | POA: Diagnosis not present

## 2017-08-09 DIAGNOSIS — M62838 Other muscle spasm: Secondary | ICD-10-CM | POA: Diagnosis not present

## 2017-08-09 DIAGNOSIS — N393 Stress incontinence (female) (male): Secondary | ICD-10-CM | POA: Diagnosis not present

## 2017-08-09 DIAGNOSIS — M6281 Muscle weakness (generalized): Secondary | ICD-10-CM | POA: Diagnosis not present

## 2017-08-30 DIAGNOSIS — M6281 Muscle weakness (generalized): Secondary | ICD-10-CM | POA: Diagnosis not present

## 2017-08-30 DIAGNOSIS — M62838 Other muscle spasm: Secondary | ICD-10-CM | POA: Diagnosis not present

## 2017-08-30 DIAGNOSIS — N393 Stress incontinence (female) (male): Secondary | ICD-10-CM | POA: Diagnosis not present

## 2017-09-11 DIAGNOSIS — Z86018 Personal history of other benign neoplasm: Secondary | ICD-10-CM | POA: Diagnosis not present

## 2017-09-11 DIAGNOSIS — D225 Melanocytic nevi of trunk: Secondary | ICD-10-CM | POA: Diagnosis not present

## 2017-09-11 DIAGNOSIS — D1801 Hemangioma of skin and subcutaneous tissue: Secondary | ICD-10-CM | POA: Diagnosis not present

## 2017-09-11 DIAGNOSIS — L814 Other melanin hyperpigmentation: Secondary | ICD-10-CM | POA: Diagnosis not present

## 2017-10-04 DIAGNOSIS — C61 Malignant neoplasm of prostate: Secondary | ICD-10-CM | POA: Diagnosis not present

## 2017-10-11 DIAGNOSIS — C61 Malignant neoplasm of prostate: Secondary | ICD-10-CM | POA: Diagnosis not present

## 2017-10-11 DIAGNOSIS — N393 Stress incontinence (female) (male): Secondary | ICD-10-CM | POA: Diagnosis not present

## 2017-10-11 DIAGNOSIS — N5201 Erectile dysfunction due to arterial insufficiency: Secondary | ICD-10-CM | POA: Diagnosis not present

## 2017-10-16 ENCOUNTER — Inpatient Hospital Stay
Admission: RE | Admit: 2017-10-16 | Discharge: 2017-10-16 | Disposition: A | Discharge status: Home / Self Care | Payer: Self-pay | Source: Ambulatory Visit | Attending: Urology | Admitting: Urology

## 2017-10-18 ENCOUNTER — Ambulatory Visit
Admission: RE | Admit: 2017-10-18 | Discharge: 2017-10-18 | Disposition: A | Discharge status: Home / Self Care | Payer: BLUE CROSS/BLUE SHIELD | Source: Ambulatory Visit | Attending: Radiation Oncology | Admitting: Radiation Oncology

## 2017-10-18 ENCOUNTER — Encounter: Payer: Self-pay | Admitting: Urology

## 2017-10-18 ENCOUNTER — Other Ambulatory Visit: Payer: Self-pay

## 2017-10-18 ENCOUNTER — Ambulatory Visit
Admission: RE | Admit: 2017-10-18 | Discharge: 2017-10-18 | Disposition: A | Discharge status: Home / Self Care | Payer: BLUE CROSS/BLUE SHIELD | Source: Ambulatory Visit | Attending: Urology | Admitting: Urology

## 2017-10-18 VITALS — BP 113/77 | HR 77 | Temp 98.1°F | Resp 18 | Ht 73.0 in | Wt 227.6 lb

## 2017-10-18 DIAGNOSIS — Z880 Allergy status to penicillin: Secondary | ICD-10-CM | POA: Diagnosis not present

## 2017-10-18 DIAGNOSIS — R9721 Rising PSA following treatment for malignant neoplasm of prostate: Secondary | ICD-10-CM | POA: Diagnosis not present

## 2017-10-18 DIAGNOSIS — Z9079 Acquired absence of other genital organ(s): Secondary | ICD-10-CM | POA: Diagnosis not present

## 2017-10-18 DIAGNOSIS — Z9889 Other specified postprocedural states: Secondary | ICD-10-CM | POA: Insufficient documentation

## 2017-10-18 DIAGNOSIS — C61 Malignant neoplasm of prostate: Secondary | ICD-10-CM | POA: Insufficient documentation

## 2017-10-18 NOTE — Progress Notes (Signed)
Radiation Oncology         [phone] ________________________________  Name: Barry Nash MRN: [id number]  Date: 10/18/2017  DOB: 02-26-1961  Follow-Up Outpatient Re-Consultation   CC: [name], MD  Raynelle Bring, MD  Diagnosis:   57 y.o. 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    ICD-10-CM   1. Malignant neoplasm of prostate ([place]) C61    Narrative:  The patient is here today to discuss radiation therapy for prostate cancer. He was noted to have an elevated PSA of 39.8 by his primary care physician, Dr. Sheryn Bison.  Accordingly, he was referred for evaluation in urology by Dr. Jeffie Pollock in January 2019 and a digital rectal examination was performed at that time revealing a 2 cm right base nodule. A repeat PSA on 03/29/17 was measured to be 44.8. The patient proceeded to transrectal ultrasound with 12 biopsies of the prostate on 04/24/17. The prostate volume measured 32 cc.  Out of 12 core biopsies, 9 were positive.  The maximum Gleason score was 4+3, and this was seen in all six cores on the the right.  Additionally, there was Gleason 3+3 disease in 3 of the 6 cores on the left.    He had a CT pelvis and bone scan for disease staging.  CT pelvis on 05/01/2017 was negative for any obvious metastatic disease.  Whole body bone scan on 05/13/17 showed focal increased radiotracer uptake to the posterior aspect of the right eighth rib, right aspect L3 vertebra, and a tiny area of increased uptake to the right femoral neck- non-specific and suspected to be degenerative.  He was seen in the office for initial consult on 05/23/2017 and at that time elected to proceed with prostatectomy for treatment of his prostate cancer.  He underwent a unilateral left nerve sparing robot- assisted laparoscopic radical prostatectomy and a bilateral pelvic lymphadenopathy performed by Dr. Alinda Money on 06/24/17.  Surgical  pathology revealed a pT3bN0 adenocarcinoma of the prostate, Gleason 4+3 with evidence of extraprostatic extension, seminal vesicle involvement on the right, lymphovascular involvement and a positive margin at the bladder neck.  There was no lymph node involvement.  His initial PSA post treatment on 10/04/17 was 0.049.   He has met with his urologist to review his surgical pathology and posttreatment PSA and has kindly been referred today to discuss the potential role of adjuvant radiotherapy in the management of his disease.  ALLERGIES:  is allergic to penicillins.  Meds: Current Outpatient Medications  Medication Sig Dispense Refill  . Cholecalciferol (VITAMIN D3) 1000 units CAPS Take by mouth.    . co-enzyme Q-10 30 MG capsule Take 30 mg by mouth 3 (three) times daily.    Marland Kitchen lactobacillus acidophilus (BACID) TABS tablet Take 2 tablets by mouth 3 (three) times daily. Probiotic taking two capsules daily    . magnesium gluconate (MAGONATE) 500 MG tablet Take 500 mg by mouth 2 (two) times daily. Taking 2 tablets daily po    . Multiple Vitamin (MULTIVITAMIN) tablet Take 1 tablet by mouth daily.    Marland Kitchen HYDROcodone-acetaminophen (NORCO) 5-325 MG tablet Take 1-2 tablets by mouth every 6 (six) hours as needed for moderate pain or severe pain. (Patient not taking: Reported on 10/18/2017) 30 tablet 0   No current facility-administered medications for this encounter.    Review of Symptoms: On review of systems, the patient reports that he is doing well overall. He denies any  chest pain, shortness of breath, cough, fevers, chills, night sweats, unintended weight changes. He denies any bowel disturbances, and denies abdominal pain, nausea or vomiting. He denies any new musculoskeletal or joint aches or pains. His IPSS was 1, indicating mild urinary symptoms. He is able to complete sexual activity with most attempts. A complete review of systems is obtained and is otherwise negative.   Physical Findings: In  general this is a well appearing caucasian gentleman in no acute distress. He is alert and oriented x4 and appropriate throughout the examination. HEENT reveals that the patient is normocephalic, atraumatic. EOMs are intact. PERRLA. Skin is intact without any evidence of gross lesions. Cardiovascular exam reveals a regular rate and rhythm, no clicks rubs or murmurs are auscultated. Chest is clear to auscultation bilaterally. Lymphatic assessment is performed and does not reveal any adenopathy in the cervical, supraclavicular, axillary, or inguinal chains. Abdomen has active bowel sounds in all quadrants and is intact. The abdomen is soft, non tender, non distended. Lower extremities are negative for pretibial pitting edema, deep calf tenderness, cyanosis or clubbing.  Vitals:   10/18/17 1451  BP: 113/77  Pulse: 77  Resp: 18  Temp: 98.1 F (36.7 C)  TempSrc: Oral  SpO2: 96%  Weight: 227 lb 9.6 oz (103.2 kg)  Height: 6' 1"  (1.854 m)     Lab Findings: Lab Results  Component Value Date   WBC 6.1 06/17/2017   HGB 13.3 06/25/2017   HCT 40.2 06/25/2017   PLT 261 06/17/2017    Lab Results  Component Value Date   NA 140 06/17/2017   K 4.4 06/17/2017   CO2 25 06/17/2017   GLUCOSE 88 06/17/2017   BUN 16 06/17/2017   CREATININE 0.93 06/17/2017   CALCIUM 8.8 (L) 06/17/2017   ANIONGAP 9 06/17/2017    Radiographic Findings: No results found.  Impression/Plan: 57 y.o. 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  Today Dr. Tammi Klippel and I reviewed the findings and workup thus far.  We discussed the natural history of prostate cancer.  We reviewed the the implications of positive margins, extracapsular extension, and seminal vesicle involvement on the risk of prostate cancer recurrence, especially in light of a detectable PSA postoperatively. We reviewed some of the evidence suggesting an advantage  for patients who undergo adjuvant radiotherapy in the setting in terms of disease control and overall survival. We also discussed some of the dilemmas related to the available evidence.  We discussed the SWOG trial which did show an improvement in disease-free survival as well as overall survival using adjuvant radiotherapy. However, we discussed the fact that the study did not carefully control the usage of adjuvant radiotherapy in the observation arm. There is increasing evidence that careful surveillance with ultrasensitive PSA may provide an opportunity for early salvage in patients who undergo observation, which can lead to excellent results in terms of disease control and survival. We discussed radiation treatment directed to the prostatic fossa with regard to the logistics and delivery of external beam radiation treatment. He was encouraged to ask questions were answered to his stated satisfaction.  At the conclusion of our conversation, the patient elects to proceed with adjuvant radiotherapy to the prostate fossa delivered over the course of 7.5 weeks.  I will share my findings with Dr. Alinda Money and move forward with coordinating his treatment.  He will have CT SIM/treatment planning at 3pm on Wednesday 10/23/17 in  anticipation of beginning treatment in the near future.     We enjoyed meeting with him and his wife today, and will look forward to participating in the care of this very nice gentleman.     Nicholos Johns, PA-C    Tyler Pita, MD  [facility] Direct Dial: [phone]  Fax: [phone] [email or web address]  Skype  [email or web address]  This document serves as a record of services personally performed by Tyler Pita, MD and Ashlyn Bruning PA-C. It was created on their behalf by Delton Coombes, a trained medical scribe. The creation of this record is based on the scribe's personal observations and the provider's statements to them.   References:  JAMA. 2006 Nov  15;296(19):2329-35.  Adjuvant radiotherapy for pathologically advanced prostate cancer: a randomized clinical trial.  Grandville Silos IM Jr(1), Tangen CM, Hettie Holstein MS, Ova Freshwater, Messing [facility] Denis ED.  Author information: (1)Department of Urology, [facility] [place], [place], [address], [place] [address], [place]. [email or web address]  CONTEXT: Despite a stage-shift to earlier cancer stages and lower tumor volumes for prostate cancer, pathologically advanced disease is detected at radical prostatectomy in 38% to 52% of patients. However, the optimal management of these patients after radical prostatectomy is unknown.  OBJECTIVE: To determine whether adjuvant radiotherapy improves metastasis-free survival in patients with stage pT3 N0 M0 prostate cancer.  DESIGN, SETTING, AND PATIENTS: Randomized, prospective, multi-institutional, [place] clinical trial with enrollment between November 08, 1986, and March 27, 1995 (with database frozen for statistical analysis on December 15, 2003). Patients were 7 men with pathologically advanced prostate cancer who had undergone radical prostatectomy. INTERVENTION: Men were randomly assigned to receive 60 to 64 Gy of external beam radiotherapy delivered to the prostatic fossa (n = 214) or usual care plus observation (n = 211).  MAIN OUTCOME MEASURES: Primary outcome was metastasis-free survival, defined as time to first occurrence of metastatic disease or death due to any cause. Secondary outcomes included prostate-specific antigen (PSA) relapse, recurrence-free survival, overall survival, freedom from hormonal therapy, and postoperative complications.  RESULTS: Among the 425 men, median follow-up was 10.6 years (interquartile range, 9.2-12.7 years). For metastasis-free survival, 76 (35.5%) of 214 men in the adjuvant radiotherapy group were diagnosed with metastatic  disease or died (median metastasis-free estimate, 14.7 years), compared with 91 (43.1%) of 211 (median metastasis-free estimate, 13.2 years) of those in the observation group (hazard ratio [HR], 0.75; 95% CI, 0.55-1.02; P = .06). There were no significant between-group differences for overall survival (71 deaths, median survival of 14.7 years for radiotherapy vs 83 deaths, median survival of 13.8 years for observation; HR, 0.80; 95% CI, 0.58-1.09; P = .16). PSA relapse (median PSA relapse-free survival, 10.3 years for radiotherapy vs 3.1 years for observation; HR, 0.43; 95% CI, 0.31-0.58; P<.001) and disease recurrence (median recurrence-free survival, 13.8 years for radiotherapy vs 9.9 years for observation; HR, 0.62; 95% CI, 0.46-0.82; P = .001) were both significantly reduced with radiotherapy. Adverse effects were more common with radiotherapy vs observation (23.8% vs 11.9%), including rectal complications (9.2% vs 0%), urethral strictures (17.8% vs 9.5%), and total urinary incontinence (6.5% vs 2.8%).  CONCLUSIONS: In men who had undergone radical prostatectomy for pathologically advanced prostate cancer, adjuvant radiotherapy resulted in significantly reduced risk of PSA relapse and disease recurrence, although the improvements in metastasis-free survival and overall survival were not statistically significant.  Trial Registration clinicaltrials.gov Identifier: [id number]. PMID: [id number]  J Clin Oncol. 2007 Jun 1;25(16):2225-9. Predominant treatment failure in postprostatectomy patients is local: analysis of patterns of treatment failure in SWOG 8794.  Swanson GP(1), [place] MA, Tangen CM, Chin J, Messing E, Canby-Hagino Daiva Eves, Doy Hutching ED;  Author information: (1)Department of Radiation Oncology and Urology, [facility], [place] [address], [place]. [email or web address] Comment in [place]. 2007 Dec 10;25(35):5671-2.  PURPOSE: [facility] (SWOG) trial [phone] demonstrated that adjuvant radiation reduces the risk of biochemical (prostate-specific antigen [PSA]) treatment failure by 50% over radical prostatectomy alone. In this analysis, we stratified patients as to their preradiation PSA levels and correlated it with outcomes such as PSA treatment failure, local recurrence, and distant failure, to serve as guidelines for future research.  PATIENTS AND METHODS: Four hundred thirty-one subjects with pathologically advanced prostate cancer (extraprostatic extension, positive surgical margins, or seminal vesicle invasion) were randomly assigned to adjuvant radiotherapy or observation.  RESULTS: Three hundred seventy-four eligible patients had immediate postprostatectomy and follow-up PSA data. Median follow-up was 10.2 years. For patients with a postsurgical PSA of 0.2 ng/mL, radiation was associated with reductions in the 10-year risk of biochemical treatment failure (72% to 42%), local failures (20% to 7%), and distant failures (12% to 4%). For patients with a postsurgical PSA between higher than 0.2 and <or = 1.0 ng/mL, reductions in the 10-year risk of biochemical failure (80% to 73%), local failures (25% to 9%), and distant failures (16% to 12%) were realized. In patients with postsurgical PSA higher than 1.0, the respective findings were 94% versus 100%, 28% versus 9%, and 44% versus 18%.  CONCLUSION: The pattern of treatment failure in high-risk patients is predominantly local with a surprisingly low incidence of metastatic failure. Adjuvant radiation to the prostate bed reduces the risk of metastatic disease and biochemical failure at all postsurgical PSA levels. Further improvement in reducing local treatment failure is likely to have the greatest impact on outcome in high-risk patients after prostatectomy.  PMID: [id number]     J Urol. 2009 Mar;181(3):956-62.  Adjuvant radiotherapy for pathological T3N0M0 prostate cancer significantly  reduces risk of metastases and improves survival: long-term followup of a randomized clinical trial.  Grandville Silos IM(1), Tangen CM, Hettie Holstein MS, Ova Freshwater, Messing [facility] Denis [facility].  Chief Strategy Officer information: (1)[employer] [facility] [place], [place], [place], [place]. PURPOSE: Extraprostatic disease will be manifest in a third of men after radical prostatectomy. We present the long-term followup of a randomized clinical trial of radiotherapy to reduce the risk of subsequent metastatic disease and death.  MATERIALS AND METHODS: A total of 431 men with pT3N0M0 prostate cancer were randomized to 60 to 64 Gy adjuvant radiotherapy or observation. The primary study end point was metastasis-free survival.  RESULTS: Of 425 eligible men 211 were randomized to observation and 214 to adjuvant radiation. Of those men under observation 70 ultimately received radiotherapy. Metastasis-free survival was significantly greater with radiotherapy (93 of 214 events on the radiotherapy arm vs 114 of 211 events on observation; HR 0.71; 95% CI 0.54, 0.94; p = 0.016). Survival improved significantly with adjuvant radiation (88 deaths of 214 on the radiotherapy arm vs 110 deaths of 211 on observation; HR 0.72; 95% CI 0.55, 0.96; p = 0.023).  CONCLUSIONS: Adjuvant radiotherapy after radical prostatectomy for a man with pT3N0M0 prostate cancer significantly reduces the risk of metastasis and increases survival.  PMCID: [id number] PMID: [id number]

## 2017-10-18 NOTE — Progress Notes (Signed)
See progress note under physician encounter. 

## 2017-10-18 NOTE — Progress Notes (Signed)
GU Location of Tumor / Histology: prostatic adenocarcinoma  If Prostate Cancer, Gleason Score is (4 + 3) and PSA is (44.8). Prostate volume: 32 cc.  [name] was referred by Dr. Sheryn Bison (PCP, [facility] Borer) to Dr. Jeffie Pollock for further evaluation of an elevated PSA. Dr. Jeffie Pollock referred the patient to Dr. Alinda Money to discuss surgical options.  Biopsies of prostate (if applicable) revealed:    Past/Anticipated interventions by urology, if any:Dr. Jefm Bryant DIAGNOSIS Diagnosis 06-24-17 1. Prostate, radical resection PROSTATIC ADENOCARCINOMA, GLEASON SCORE 4+3=7 WITH TERTIARY PATTERN 5 EXTRAPROSTATIC EXTENSION IS IDENTIFIED AT THE RIGHT POSTEROLATERAL MID, BASE AND RIGHT BLADDER NECK AREA RIGHT SEMINAL VESICLE INFILTRATION IS PRESENT (PT3B) LYMPHOVASCULAR INVASION IS IDENTIFIED MARGIN OF RESECTION IS POSITIVE AT RIGHT BLADDER NECK AREA WHERE EPE IS PRESENT (2 MM, GLEASON SCORE 3+4=7) ALL OTHER RESECTION MARGINS ARE NEGATIVE FOR CARCINOMA 2. Lymph nodes, regional resection, left pelvic THREE BENIGN LYMPH NODES (0/3) 3. Lymph nodes, regional resection, right pelvic FOUR BENIGN LYMPH NODES (0/4) Microscopic Comment 1. PROSTATE RADICAL RESECTION/PROSTATECTOMY   prostate biopsy, CT scan of abd/pelvis (negative), bone scan (Focal increased radiotracer uptake posterior aspect right eighth rib, right aspect L3 vertebra and tiny area of increased uptake right femoral neck. No comparison exams to determine if these areas represent changes related to degenerative disease/prior fracture.Small metastatic foci therefore cannot be excluded.), referral to radiation oncology   Past/Anticipated interventions by medical oncology, if any: No  Weight changes, if any:  Wt Readings from Last 3 Encounters:  10/18/17 227 lb 9.6 oz (103.2 kg)  06/24/17 228 lb (103.4 kg)  06/17/17 228 lb 4 oz (103.5 kg)    Bowel/Bladder complaints, if any:  IPSS 2. Denies dysuria, hematuria, leakage or  incontinence.   Nausea/Vomiting, if any: No  Pain issues, if any: No  SAFETY ISSUES:  Prior radiation? No  Pacemaker/ICD? No  Possible current pregnancy? Barry Nash  Is the patient on methotrexate? No  Current Complaints / other details:  57 year old male. Married with one daughter and one son.  BP 113/77 (BP Location: Left Arm, Patient Position: Sitting, Cuff Size: Large)   Pulse 77   Temp 98.1 F (36.7 C) (Oral)   Resp 18   Ht 6\' 1"  (1.854 m)   Wt 227 lb 9.6 oz (103.2 kg)   SpO2 96%   BMI 30.03 kg/m

## 2017-10-23 ENCOUNTER — Ambulatory Visit
Admission: RE | Admit: 2017-10-23 | Discharge: 2017-10-23 | Disposition: A | Discharge status: Home / Self Care | Payer: BLUE CROSS/BLUE SHIELD | Source: Ambulatory Visit | Attending: Radiation Oncology | Admitting: Radiation Oncology

## 2017-10-23 DIAGNOSIS — C61 Malignant neoplasm of prostate: Secondary | ICD-10-CM | POA: Insufficient documentation

## 2017-10-23 NOTE — Addendum Note (Signed)
Encounter addended by: Tyler Pita, MD on: 10/23/2017 7:19 PM  Actions taken: Medication List reviewed, Allergies reviewed, Problem List reviewed

## 2017-10-23 NOTE — Progress Notes (Signed)
  Radiation Oncology         [phone] ________________________________  Name: Barry Nash MRN: [id number]  Date: 10/23/2017  DOB: 1961/03/04  SIMULATION AND TREATMENT PLANNING NOTE    ICD-10-CM   1. 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  Identity was confirmed.  All relevant records and images related to the planned course of therapy were reviewed.  The patient freely provided informed written consent to proceed with treatment after reviewing the details related to the planned course of therapy. The consent form was witnessed and verified by the simulation staff.  Then, the patient was set-up in a stable reproducible supine position for radiation therapy.  A vacuum lock pillow device was custom fabricated to position his legs in a reproducible immobilized position.  Then, I performed a urethrogram under sterile conditions to identify the prostatic apex.  CT images were obtained.  Surface markings were placed.  The CT images were loaded into the planning software.  Then the prostate target and avoidance structures including the rectum, bladder, bowel and hips were contoured.  Treatment planning then occurred.  The radiation prescription was entered and confirmed.  A total of one complex treatment devices was fabricated. I have requested : Intensity Modulated Radiotherapy (IMRT) is medically necessary for this case for the following reason:  Rectal sparing.  PLAN:  The patient will receive 68.4 Gy in 38 fractions.  ________________________________  Sheral Apley Tammi Klippel, M.D.  This document serves as a record of services personally performed by Tyler Pita, MD. It was created on his behalf by Wilburn Mylar, a trained medical scribe. The creation of this record is based on the  scribe's personal observations and the provider's statements to them. This document has been checked and approved by the attending provider.

## 2017-10-31 DIAGNOSIS — C61 Malignant neoplasm of prostate: Secondary | ICD-10-CM | POA: Diagnosis not present

## 2017-10-31 DIAGNOSIS — Z510 Encounter for antineoplastic radiation therapy: Secondary | ICD-10-CM | POA: Diagnosis not present

## 2017-11-04 ENCOUNTER — Encounter: Payer: Self-pay | Admitting: Medical Oncology

## 2017-11-04 ENCOUNTER — Ambulatory Visit
Admission: RE | Admit: 2017-11-04 | Discharge: 2017-11-04 | Disposition: A | Discharge status: Home / Self Care | Payer: BLUE CROSS/BLUE SHIELD | Source: Ambulatory Visit | Attending: Radiation Oncology | Admitting: Radiation Oncology

## 2017-11-04 DIAGNOSIS — Z510 Encounter for antineoplastic radiation therapy: Secondary | ICD-10-CM | POA: Diagnosis not present

## 2017-11-04 DIAGNOSIS — C61 Malignant neoplasm of prostate: Secondary | ICD-10-CM | POA: Diagnosis not present

## 2017-11-05 ENCOUNTER — Ambulatory Visit
Admission: RE | Admit: 2017-11-05 | Discharge: 2017-11-05 | Disposition: A | Discharge status: Home / Self Care | Payer: BLUE CROSS/BLUE SHIELD | Source: Ambulatory Visit | Attending: Radiation Oncology | Admitting: Radiation Oncology

## 2017-11-05 DIAGNOSIS — C61 Malignant neoplasm of prostate: Secondary | ICD-10-CM | POA: Diagnosis not present

## 2017-11-05 DIAGNOSIS — Z510 Encounter for antineoplastic radiation therapy: Secondary | ICD-10-CM | POA: Diagnosis not present

## 2017-11-06 ENCOUNTER — Ambulatory Visit
Admission: RE | Admit: 2017-11-06 | Discharge: 2017-11-06 | Disposition: A | Discharge status: Home / Self Care | Payer: BLUE CROSS/BLUE SHIELD | Source: Ambulatory Visit | Attending: Radiation Oncology | Admitting: Radiation Oncology

## 2017-11-06 DIAGNOSIS — C61 Malignant neoplasm of prostate: Secondary | ICD-10-CM | POA: Diagnosis not present

## 2017-11-06 DIAGNOSIS — Z510 Encounter for antineoplastic radiation therapy: Secondary | ICD-10-CM | POA: Diagnosis not present

## 2017-11-07 ENCOUNTER — Ambulatory Visit
Admission: RE | Admit: 2017-11-07 | Discharge: 2017-11-07 | Disposition: A | Discharge status: Home / Self Care | Payer: BLUE CROSS/BLUE SHIELD | Source: Ambulatory Visit | Attending: Radiation Oncology | Admitting: Radiation Oncology

## 2017-11-07 DIAGNOSIS — Z510 Encounter for antineoplastic radiation therapy: Secondary | ICD-10-CM | POA: Diagnosis not present

## 2017-11-07 DIAGNOSIS — C61 Malignant neoplasm of prostate: Secondary | ICD-10-CM | POA: Diagnosis not present

## 2017-11-08 ENCOUNTER — Ambulatory Visit
Admission: RE | Admit: 2017-11-08 | Discharge: 2017-11-08 | Disposition: A | Discharge status: Home / Self Care | Payer: BLUE CROSS/BLUE SHIELD | Source: Ambulatory Visit | Attending: Radiation Oncology | Admitting: Radiation Oncology

## 2017-11-08 DIAGNOSIS — Z510 Encounter for antineoplastic radiation therapy: Secondary | ICD-10-CM | POA: Diagnosis not present

## 2017-11-08 DIAGNOSIS — C61 Malignant neoplasm of prostate: Secondary | ICD-10-CM | POA: Diagnosis not present

## 2017-11-11 ENCOUNTER — Ambulatory Visit
Admission: RE | Admit: 2017-11-11 | Discharge: 2017-11-11 | Disposition: A | Discharge status: Home / Self Care | Payer: BLUE CROSS/BLUE SHIELD | Source: Ambulatory Visit | Attending: Radiation Oncology | Admitting: Radiation Oncology

## 2017-11-11 ENCOUNTER — Encounter: Payer: Self-pay | Admitting: Medical Oncology

## 2017-11-11 DIAGNOSIS — Z510 Encounter for antineoplastic radiation therapy: Secondary | ICD-10-CM | POA: Diagnosis not present

## 2017-11-11 DIAGNOSIS — C61 Malignant neoplasm of prostate: Secondary | ICD-10-CM | POA: Diagnosis not present

## 2017-11-12 ENCOUNTER — Ambulatory Visit
Admission: RE | Admit: 2017-11-12 | Discharge: 2017-11-12 | Disposition: A | Discharge status: Home / Self Care | Payer: BLUE CROSS/BLUE SHIELD | Source: Ambulatory Visit | Attending: Radiation Oncology | Admitting: Radiation Oncology

## 2017-11-12 DIAGNOSIS — C61 Malignant neoplasm of prostate: Secondary | ICD-10-CM | POA: Diagnosis not present

## 2017-11-12 DIAGNOSIS — Z510 Encounter for antineoplastic radiation therapy: Secondary | ICD-10-CM | POA: Diagnosis not present

## 2017-11-13 ENCOUNTER — Ambulatory Visit
Admission: RE | Admit: 2017-11-13 | Discharge: 2017-11-13 | Disposition: A | Discharge status: Home / Self Care | Payer: BLUE CROSS/BLUE SHIELD | Source: Ambulatory Visit | Attending: Radiation Oncology | Admitting: Radiation Oncology

## 2017-11-13 DIAGNOSIS — Z510 Encounter for antineoplastic radiation therapy: Secondary | ICD-10-CM | POA: Diagnosis not present

## 2017-11-13 DIAGNOSIS — C61 Malignant neoplasm of prostate: Secondary | ICD-10-CM | POA: Diagnosis not present

## 2017-11-14 ENCOUNTER — Ambulatory Visit
Admission: RE | Admit: 2017-11-14 | Discharge: 2017-11-14 | Disposition: A | Discharge status: Home / Self Care | Payer: BLUE CROSS/BLUE SHIELD | Source: Ambulatory Visit | Attending: Radiation Oncology | Admitting: Radiation Oncology

## 2017-11-14 DIAGNOSIS — C61 Malignant neoplasm of prostate: Secondary | ICD-10-CM | POA: Diagnosis not present

## 2017-11-14 DIAGNOSIS — Z510 Encounter for antineoplastic radiation therapy: Secondary | ICD-10-CM | POA: Diagnosis not present

## 2017-11-15 ENCOUNTER — Ambulatory Visit
Admission: RE | Admit: 2017-11-15 | Discharge: 2017-11-15 | Disposition: A | Discharge status: Home / Self Care | Payer: BLUE CROSS/BLUE SHIELD | Source: Ambulatory Visit | Attending: Radiation Oncology | Admitting: Radiation Oncology

## 2017-11-15 DIAGNOSIS — Z510 Encounter for antineoplastic radiation therapy: Secondary | ICD-10-CM | POA: Diagnosis not present

## 2017-11-15 DIAGNOSIS — C61 Malignant neoplasm of prostate: Secondary | ICD-10-CM | POA: Diagnosis not present

## 2017-11-18 ENCOUNTER — Ambulatory Visit
Admission: RE | Admit: 2017-11-18 | Discharge: 2017-11-18 | Disposition: A | Discharge status: Home / Self Care | Payer: BLUE CROSS/BLUE SHIELD | Source: Ambulatory Visit | Attending: Radiation Oncology | Admitting: Radiation Oncology

## 2017-11-18 DIAGNOSIS — Z510 Encounter for antineoplastic radiation therapy: Secondary | ICD-10-CM | POA: Diagnosis not present

## 2017-11-18 DIAGNOSIS — C61 Malignant neoplasm of prostate: Secondary | ICD-10-CM | POA: Diagnosis not present

## 2017-11-19 ENCOUNTER — Ambulatory Visit
Admission: RE | Admit: 2017-11-19 | Discharge: 2017-11-19 | Disposition: A | Discharge status: Home / Self Care | Payer: BLUE CROSS/BLUE SHIELD | Source: Ambulatory Visit | Attending: Radiation Oncology | Admitting: Radiation Oncology

## 2017-11-19 DIAGNOSIS — Z510 Encounter for antineoplastic radiation therapy: Secondary | ICD-10-CM | POA: Diagnosis not present

## 2017-11-19 DIAGNOSIS — C61 Malignant neoplasm of prostate: Secondary | ICD-10-CM | POA: Diagnosis not present

## 2017-11-20 ENCOUNTER — Ambulatory Visit
Admission: RE | Admit: 2017-11-20 | Discharge: 2017-11-20 | Disposition: A | Discharge status: Home / Self Care | Payer: BLUE CROSS/BLUE SHIELD | Source: Ambulatory Visit | Attending: Radiation Oncology | Admitting: Radiation Oncology

## 2017-11-20 DIAGNOSIS — C61 Malignant neoplasm of prostate: Secondary | ICD-10-CM | POA: Diagnosis not present

## 2017-11-20 DIAGNOSIS — Z510 Encounter for antineoplastic radiation therapy: Secondary | ICD-10-CM | POA: Diagnosis not present

## 2017-11-21 ENCOUNTER — Ambulatory Visit
Admission: RE | Admit: 2017-11-21 | Discharge: 2017-11-21 | Disposition: A | Discharge status: Home / Self Care | Payer: BLUE CROSS/BLUE SHIELD | Source: Ambulatory Visit | Attending: Radiation Oncology | Admitting: Radiation Oncology

## 2017-11-21 DIAGNOSIS — Z510 Encounter for antineoplastic radiation therapy: Secondary | ICD-10-CM | POA: Diagnosis not present

## 2017-11-21 DIAGNOSIS — C61 Malignant neoplasm of prostate: Secondary | ICD-10-CM | POA: Diagnosis not present

## 2017-11-22 ENCOUNTER — Ambulatory Visit
Admission: RE | Admit: 2017-11-22 | Discharge: 2017-11-22 | Disposition: A | Discharge status: Home / Self Care | Payer: BLUE CROSS/BLUE SHIELD | Source: Ambulatory Visit | Attending: Radiation Oncology | Admitting: Radiation Oncology

## 2017-11-22 DIAGNOSIS — B070 Plantar wart: Secondary | ICD-10-CM | POA: Diagnosis not present

## 2017-11-22 DIAGNOSIS — M25562 Pain in left knee: Secondary | ICD-10-CM | POA: Diagnosis not present

## 2017-11-22 DIAGNOSIS — C61 Malignant neoplasm of prostate: Secondary | ICD-10-CM | POA: Diagnosis not present

## 2017-11-22 DIAGNOSIS — N5231 Erectile dysfunction following radical prostatectomy: Secondary | ICD-10-CM | POA: Diagnosis not present

## 2017-11-22 DIAGNOSIS — Z510 Encounter for antineoplastic radiation therapy: Secondary | ICD-10-CM | POA: Diagnosis not present

## 2017-11-26 ENCOUNTER — Ambulatory Visit
Admission: RE | Admit: 2017-11-26 | Discharge: 2017-11-26 | Disposition: A | Discharge status: Home / Self Care | Payer: BLUE CROSS/BLUE SHIELD | Source: Ambulatory Visit | Attending: Radiation Oncology | Admitting: Radiation Oncology

## 2017-11-26 DIAGNOSIS — Z510 Encounter for antineoplastic radiation therapy: Secondary | ICD-10-CM | POA: Insufficient documentation

## 2017-11-26 DIAGNOSIS — C61 Malignant neoplasm of prostate: Secondary | ICD-10-CM | POA: Insufficient documentation

## 2017-11-27 ENCOUNTER — Ambulatory Visit
Admission: RE | Admit: 2017-11-27 | Discharge: 2017-11-27 | Disposition: A | Discharge status: Home / Self Care | Payer: BLUE CROSS/BLUE SHIELD | Source: Ambulatory Visit | Attending: Radiation Oncology | Admitting: Radiation Oncology

## 2017-11-27 DIAGNOSIS — Z510 Encounter for antineoplastic radiation therapy: Secondary | ICD-10-CM | POA: Diagnosis not present

## 2017-11-27 DIAGNOSIS — C61 Malignant neoplasm of prostate: Secondary | ICD-10-CM | POA: Diagnosis not present

## 2017-11-28 ENCOUNTER — Ambulatory Visit
Admission: RE | Admit: 2017-11-28 | Discharge: 2017-11-28 | Disposition: A | Discharge status: Home / Self Care | Payer: BLUE CROSS/BLUE SHIELD | Source: Ambulatory Visit | Attending: Radiation Oncology | Admitting: Radiation Oncology

## 2017-11-28 DIAGNOSIS — Z510 Encounter for antineoplastic radiation therapy: Secondary | ICD-10-CM | POA: Diagnosis not present

## 2017-11-28 DIAGNOSIS — C61 Malignant neoplasm of prostate: Secondary | ICD-10-CM | POA: Diagnosis not present

## 2017-11-29 ENCOUNTER — Ambulatory Visit
Admission: RE | Admit: 2017-11-29 | Discharge: 2017-11-29 | Disposition: A | Discharge status: Home / Self Care | Payer: BLUE CROSS/BLUE SHIELD | Source: Ambulatory Visit | Attending: Radiation Oncology | Admitting: Radiation Oncology

## 2017-11-29 DIAGNOSIS — C61 Malignant neoplasm of prostate: Secondary | ICD-10-CM | POA: Diagnosis not present

## 2017-11-29 DIAGNOSIS — Z510 Encounter for antineoplastic radiation therapy: Secondary | ICD-10-CM | POA: Diagnosis not present

## 2017-12-02 ENCOUNTER — Ambulatory Visit
Admission: RE | Admit: 2017-12-02 | Discharge: 2017-12-02 | Disposition: A | Discharge status: Home / Self Care | Payer: BLUE CROSS/BLUE SHIELD | Source: Ambulatory Visit | Attending: Radiation Oncology | Admitting: Radiation Oncology

## 2017-12-02 DIAGNOSIS — Z510 Encounter for antineoplastic radiation therapy: Secondary | ICD-10-CM | POA: Diagnosis not present

## 2017-12-02 DIAGNOSIS — C61 Malignant neoplasm of prostate: Secondary | ICD-10-CM | POA: Diagnosis not present

## 2017-12-03 ENCOUNTER — Ambulatory Visit
Admission: RE | Admit: 2017-12-03 | Discharge: 2017-12-03 | Disposition: A | Discharge status: Home / Self Care | Payer: BLUE CROSS/BLUE SHIELD | Source: Ambulatory Visit | Attending: Radiation Oncology | Admitting: Radiation Oncology

## 2017-12-03 DIAGNOSIS — Z510 Encounter for antineoplastic radiation therapy: Secondary | ICD-10-CM | POA: Diagnosis not present

## 2017-12-03 DIAGNOSIS — C61 Malignant neoplasm of prostate: Secondary | ICD-10-CM | POA: Diagnosis not present

## 2017-12-04 ENCOUNTER — Ambulatory Visit
Admission: RE | Admit: 2017-12-04 | Discharge: 2017-12-04 | Disposition: A | Discharge status: Home / Self Care | Payer: BLUE CROSS/BLUE SHIELD | Source: Ambulatory Visit | Attending: Radiation Oncology | Admitting: Radiation Oncology

## 2017-12-04 DIAGNOSIS — C61 Malignant neoplasm of prostate: Secondary | ICD-10-CM | POA: Diagnosis not present

## 2017-12-04 DIAGNOSIS — Z510 Encounter for antineoplastic radiation therapy: Secondary | ICD-10-CM | POA: Diagnosis not present

## 2017-12-05 ENCOUNTER — Ambulatory Visit
Admission: RE | Admit: 2017-12-05 | Discharge: 2017-12-05 | Disposition: A | Discharge status: Home / Self Care | Payer: BLUE CROSS/BLUE SHIELD | Source: Ambulatory Visit | Attending: Radiation Oncology | Admitting: Radiation Oncology

## 2017-12-05 DIAGNOSIS — Z510 Encounter for antineoplastic radiation therapy: Secondary | ICD-10-CM | POA: Diagnosis not present

## 2017-12-05 DIAGNOSIS — C61 Malignant neoplasm of prostate: Secondary | ICD-10-CM | POA: Diagnosis not present

## 2017-12-06 ENCOUNTER — Ambulatory Visit
Admission: RE | Admit: 2017-12-06 | Discharge: 2017-12-06 | Disposition: A | Discharge status: Home / Self Care | Payer: BLUE CROSS/BLUE SHIELD | Source: Ambulatory Visit | Attending: Radiation Oncology | Admitting: Radiation Oncology

## 2017-12-06 DIAGNOSIS — Z510 Encounter for antineoplastic radiation therapy: Secondary | ICD-10-CM | POA: Diagnosis not present

## 2017-12-06 DIAGNOSIS — C61 Malignant neoplasm of prostate: Secondary | ICD-10-CM | POA: Diagnosis not present

## 2017-12-09 ENCOUNTER — Ambulatory Visit
Admission: RE | Admit: 2017-12-09 | Discharge: 2017-12-09 | Disposition: A | Discharge status: Home / Self Care | Payer: BLUE CROSS/BLUE SHIELD | Source: Ambulatory Visit | Attending: Radiation Oncology | Admitting: Radiation Oncology

## 2017-12-09 DIAGNOSIS — Z510 Encounter for antineoplastic radiation therapy: Secondary | ICD-10-CM | POA: Diagnosis not present

## 2017-12-09 DIAGNOSIS — C61 Malignant neoplasm of prostate: Secondary | ICD-10-CM | POA: Diagnosis not present

## 2017-12-10 ENCOUNTER — Ambulatory Visit: Payer: BLUE CROSS/BLUE SHIELD

## 2017-12-11 ENCOUNTER — Ambulatory Visit
Admission: RE | Admit: 2017-12-11 | Discharge: 2017-12-11 | Disposition: A | Discharge status: Home / Self Care | Payer: BLUE CROSS/BLUE SHIELD | Source: Ambulatory Visit | Attending: Radiation Oncology | Admitting: Radiation Oncology

## 2017-12-11 DIAGNOSIS — C61 Malignant neoplasm of prostate: Secondary | ICD-10-CM | POA: Diagnosis not present

## 2017-12-11 DIAGNOSIS — Z510 Encounter for antineoplastic radiation therapy: Secondary | ICD-10-CM | POA: Diagnosis not present

## 2017-12-12 ENCOUNTER — Ambulatory Visit
Admission: RE | Admit: 2017-12-12 | Discharge: 2017-12-12 | Disposition: A | Discharge status: Home / Self Care | Payer: BLUE CROSS/BLUE SHIELD | Source: Ambulatory Visit | Attending: Radiation Oncology | Admitting: Radiation Oncology

## 2017-12-12 DIAGNOSIS — C61 Malignant neoplasm of prostate: Secondary | ICD-10-CM | POA: Diagnosis not present

## 2017-12-12 DIAGNOSIS — Z510 Encounter for antineoplastic radiation therapy: Secondary | ICD-10-CM | POA: Diagnosis not present

## 2017-12-13 ENCOUNTER — Ambulatory Visit
Admission: RE | Admit: 2017-12-13 | Discharge: 2017-12-13 | Disposition: A | Discharge status: Home / Self Care | Payer: BLUE CROSS/BLUE SHIELD | Source: Ambulatory Visit | Attending: Radiation Oncology | Admitting: Radiation Oncology

## 2017-12-13 DIAGNOSIS — Z510 Encounter for antineoplastic radiation therapy: Secondary | ICD-10-CM | POA: Diagnosis not present

## 2017-12-13 DIAGNOSIS — C61 Malignant neoplasm of prostate: Secondary | ICD-10-CM | POA: Diagnosis not present

## 2017-12-16 ENCOUNTER — Ambulatory Visit
Admission: RE | Admit: 2017-12-16 | Discharge: 2017-12-16 | Disposition: A | Discharge status: Home / Self Care | Payer: BLUE CROSS/BLUE SHIELD | Source: Ambulatory Visit | Attending: Radiation Oncology | Admitting: Radiation Oncology

## 2017-12-16 DIAGNOSIS — C61 Malignant neoplasm of prostate: Secondary | ICD-10-CM | POA: Diagnosis not present

## 2017-12-16 DIAGNOSIS — Z510 Encounter for antineoplastic radiation therapy: Secondary | ICD-10-CM | POA: Diagnosis not present

## 2017-12-17 ENCOUNTER — Ambulatory Visit
Admission: RE | Admit: 2017-12-17 | Discharge: 2017-12-17 | Disposition: A | Discharge status: Home / Self Care | Payer: BLUE CROSS/BLUE SHIELD | Source: Ambulatory Visit | Attending: Radiation Oncology | Admitting: Radiation Oncology

## 2017-12-17 DIAGNOSIS — Z510 Encounter for antineoplastic radiation therapy: Secondary | ICD-10-CM | POA: Diagnosis not present

## 2017-12-17 DIAGNOSIS — C61 Malignant neoplasm of prostate: Secondary | ICD-10-CM | POA: Diagnosis not present

## 2017-12-18 ENCOUNTER — Ambulatory Visit
Admission: RE | Admit: 2017-12-18 | Discharge: 2017-12-18 | Disposition: A | Discharge status: Home / Self Care | Payer: BLUE CROSS/BLUE SHIELD | Source: Ambulatory Visit | Attending: Radiation Oncology | Admitting: Radiation Oncology

## 2017-12-18 DIAGNOSIS — Z510 Encounter for antineoplastic radiation therapy: Secondary | ICD-10-CM | POA: Diagnosis not present

## 2017-12-18 DIAGNOSIS — C61 Malignant neoplasm of prostate: Secondary | ICD-10-CM | POA: Diagnosis not present

## 2017-12-19 ENCOUNTER — Ambulatory Visit
Admission: RE | Admit: 2017-12-19 | Discharge: 2017-12-19 | Disposition: A | Discharge status: Home / Self Care | Payer: BLUE CROSS/BLUE SHIELD | Source: Ambulatory Visit | Attending: Radiation Oncology | Admitting: Radiation Oncology

## 2017-12-19 DIAGNOSIS — Z510 Encounter for antineoplastic radiation therapy: Secondary | ICD-10-CM | POA: Diagnosis not present

## 2017-12-19 DIAGNOSIS — C61 Malignant neoplasm of prostate: Secondary | ICD-10-CM | POA: Diagnosis not present

## 2017-12-20 ENCOUNTER — Ambulatory Visit
Admission: RE | Admit: 2017-12-20 | Discharge: 2017-12-20 | Disposition: A | Discharge status: Home / Self Care | Payer: BLUE CROSS/BLUE SHIELD | Source: Ambulatory Visit | Attending: Radiation Oncology | Admitting: Radiation Oncology

## 2017-12-20 DIAGNOSIS — Z510 Encounter for antineoplastic radiation therapy: Secondary | ICD-10-CM | POA: Diagnosis not present

## 2017-12-20 DIAGNOSIS — C61 Malignant neoplasm of prostate: Secondary | ICD-10-CM | POA: Diagnosis not present

## 2017-12-23 ENCOUNTER — Ambulatory Visit
Admission: RE | Admit: 2017-12-23 | Discharge: 2017-12-23 | Disposition: A | Discharge status: Home / Self Care | Payer: BLUE CROSS/BLUE SHIELD | Source: Ambulatory Visit | Attending: Radiation Oncology | Admitting: Radiation Oncology

## 2017-12-23 DIAGNOSIS — C61 Malignant neoplasm of prostate: Secondary | ICD-10-CM | POA: Diagnosis not present

## 2017-12-23 DIAGNOSIS — Z510 Encounter for antineoplastic radiation therapy: Secondary | ICD-10-CM | POA: Diagnosis not present

## 2017-12-24 ENCOUNTER — Encounter: Payer: Self-pay | Admitting: Medical Oncology

## 2017-12-24 ENCOUNTER — Ambulatory Visit
Admission: RE | Admit: 2017-12-24 | Discharge: 2017-12-24 | Disposition: A | Discharge status: Home / Self Care | Payer: BLUE CROSS/BLUE SHIELD | Source: Ambulatory Visit | Attending: Radiation Oncology | Admitting: Radiation Oncology

## 2017-12-24 DIAGNOSIS — Z510 Encounter for antineoplastic radiation therapy: Secondary | ICD-10-CM | POA: Insufficient documentation

## 2017-12-24 DIAGNOSIS — C61 Malignant neoplasm of prostate: Secondary | ICD-10-CM | POA: Diagnosis not present

## 2017-12-25 ENCOUNTER — Ambulatory Visit
Admission: RE | Admit: 2017-12-25 | Discharge: 2017-12-25 | Disposition: A | Discharge status: Home / Self Care | Payer: BLUE CROSS/BLUE SHIELD | Source: Ambulatory Visit | Attending: Radiation Oncology | Admitting: Radiation Oncology

## 2017-12-25 DIAGNOSIS — C61 Malignant neoplasm of prostate: Secondary | ICD-10-CM | POA: Diagnosis not present

## 2017-12-25 DIAGNOSIS — Z510 Encounter for antineoplastic radiation therapy: Secondary | ICD-10-CM | POA: Diagnosis not present

## 2017-12-26 ENCOUNTER — Ambulatory Visit: Payer: BLUE CROSS/BLUE SHIELD

## 2017-12-26 ENCOUNTER — Ambulatory Visit
Admission: RE | Admit: 2017-12-26 | Discharge: 2017-12-26 | Disposition: A | Discharge status: Home / Self Care | Payer: BLUE CROSS/BLUE SHIELD | Source: Ambulatory Visit | Attending: Radiation Oncology | Admitting: Radiation Oncology

## 2017-12-26 DIAGNOSIS — C61 Malignant neoplasm of prostate: Secondary | ICD-10-CM | POA: Diagnosis not present

## 2017-12-26 DIAGNOSIS — Z510 Encounter for antineoplastic radiation therapy: Secondary | ICD-10-CM | POA: Diagnosis not present

## 2017-12-27 ENCOUNTER — Encounter: Payer: Self-pay | Admitting: Radiation Oncology

## 2017-12-27 ENCOUNTER — Ambulatory Visit
Admission: RE | Admit: 2017-12-27 | Discharge: 2017-12-27 | Disposition: A | Discharge status: Home / Self Care | Payer: BLUE CROSS/BLUE SHIELD | Source: Ambulatory Visit | Attending: Radiation Oncology | Admitting: Radiation Oncology

## 2017-12-27 ENCOUNTER — Encounter: Payer: Self-pay | Admitting: Medical Oncology

## 2017-12-27 DIAGNOSIS — C61 Malignant neoplasm of prostate: Secondary | ICD-10-CM | POA: Diagnosis not present

## 2017-12-27 DIAGNOSIS — Z510 Encounter for antineoplastic radiation therapy: Secondary | ICD-10-CM | POA: Diagnosis not present

## 2018-01-02 NOTE — Progress Notes (Signed)
  Radiation Oncology         [phone] ________________________________  Name: Barry Nash MRN: [id number]  Date: 12/27/2017  DOB: 1960-11-15  End of Treatment Note  Diagnosis:   57 y.o. male with detectable PSA s/p RALP for pT3bN0Mxadenocarcinoma of the prostate with Gleason Score of 4+3, and post-prostatectomy PSA of 0.049     Indication for treatment:  Curative, Adjuvant Prostatic Fossa Radiotherapy       Radiation treatment dates:   11/04/2017 - 12/27/2017  Site/dose:   The prostatic fossa was treated to 68.4 Gy in 38 fractions of 1.8 Gy  Beams/energy:   The prostatic fossa was treated using helical intensity modulated radiotherapy delivering 6 megavolt photons. Image guidance was performed with megavoltage CT studies prior to each fraction. He was immobilized with a body fix lower extremity mold.  Narrative: The patient tolerated radiation treatment relatively well.  He experienced mild fatigue and very minor urinary irritation with nocturia x1. He also reported having occasional diarrhea.   Plan: The patient has completed radiation treatment. He will return to radiation oncology clinic for routine followup in one month. I advised him to call or return sooner if he has any questions or concerns related to his recovery or treatment. ________________________________  Sheral Apley. Tammi Klippel, M.D.  This document serves as a record of services personally performed by Tyler Pita, MD. It was created on his behalf by Rae Lips, a trained medical scribe. The creation of this record is based on the scribe's personal observations and the provider's statements to them. This document has been checked and approved by the attending provider.

## 2018-01-20 ENCOUNTER — Telehealth: Payer: Self-pay | Admitting: Radiation Oncology

## 2018-01-20 NOTE — Progress Notes (Signed)
Received voicemail message that the patient wishes to reschedule his November 5th follow up with Freeman Caldron, PA-C because he "will be out of the country." Patient reports he is able to see Ashlyn anytime this week. Patient reports he is scheduled to follow up with his urologist, Dr. Alinda Money, on 04/16/2018. He denies dysuria, hematuria, leakage, or urgency. Reports a steady urine stream without difficulty emptying his bladder. Reports nocturia x 1-2. Denies diarrhea. Reports energy level has gradually returned to normal even though he has been traveling a lot. Patient understands this RN will inform Ashlyn of these findings and phone him back with further instructions.

## 2018-01-21 ENCOUNTER — Telehealth: Payer: Self-pay | Admitting: *Deleted

## 2018-01-21 NOTE — Telephone Encounter (Signed)
CALLED PATIENT TO ASK ABOUT COMING IN FOR FU APPT. ON 01-23-18 @ 10:30 AM INSTEAD OF 01-28-18, LVM FOR A RETURN CALL

## 2018-01-23 ENCOUNTER — Ambulatory Visit
Admission: RE | Admit: 2018-01-23 | Discharge: 2018-01-23 | Disposition: A | Discharge status: Home / Self Care | Payer: BLUE CROSS/BLUE SHIELD | Source: Ambulatory Visit | Attending: Urology | Admitting: Urology

## 2018-01-23 ENCOUNTER — Encounter: Payer: Self-pay | Admitting: Urology

## 2018-01-23 ENCOUNTER — Other Ambulatory Visit: Payer: Self-pay

## 2018-01-23 VITALS — BP 124/77 | HR 77 | Temp 98.2°F | Resp 20 | Ht 73.0 in | Wt 231.6 lb

## 2018-01-23 DIAGNOSIS — Z79899 Other long term (current) drug therapy: Secondary | ICD-10-CM | POA: Insufficient documentation

## 2018-01-23 DIAGNOSIS — Z923 Personal history of irradiation: Secondary | ICD-10-CM | POA: Insufficient documentation

## 2018-01-23 DIAGNOSIS — C61 Malignant neoplasm of prostate: Secondary | ICD-10-CM | POA: Insufficient documentation

## 2018-01-23 DIAGNOSIS — Z880 Allergy status to penicillin: Secondary | ICD-10-CM | POA: Insufficient documentation

## 2018-01-23 NOTE — Progress Notes (Signed)
Radiation Oncology         [phone]) [phone] ________________________________  Name: Vipul Cafarelli MRN: [id number]  Date: 01/23/2018  DOB: September 14, 1960  Post Treatment Note  CC: [name], MD  Raynelle Bring, MD  Diagnosis:   57 y.o. 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  He tolerated radiation treatment relatively well.  He experienced mild fatigue and very minor urinary irritation with nocturia x1. He also reported having occasional diarrhea.                               On review of systems, the patient states that he is doing very well overall.  He denies any significant fatigue associated with radiotherapy and has noticed gradual improvement in his lower urinary tract symptoms his current IPSS score is 2 indicating minimal urinary symptoms.  He continues with nocturia 1-2 times per night which is not bothersome.  He specifically denies dysuria, gross hematuria, excessive daytime frequency, urgency, incomplete bladder emptying, weak stream, intermittency or incontinence.  He denies abdominal pain, nausea, vomiting, diarrhea or constipation.  He reports a healthy appetite and is maintaining his weight.  Overall, he is quite pleased with his progress to date.  He has a scheduled follow-up visit with Dr. Alinda Money on April 23, 2017.  ALLERGIES:  is allergic to penicillins.  Meds: Current Outpatient Medications  Medication Sig Dispense Refill  . Cholecalciferol (VITAMIN D3) 1000 units CAPS Take by mouth.    . co-enzyme Q-10 30 MG capsule Take 30 mg by mouth 3 (three) times daily.    Marland Kitchen lactobacillus acidophilus (BACID) TABS tablet Take 2 tablets by mouth 3 (three) times daily. Probiotic  taking two capsules daily    . magnesium gluconate (MAGONATE) 500 MG tablet Take 500 mg by mouth 2 (two) times daily. Taking 2 tablets daily po    . Multiple Vitamin (MULTIVITAMIN) tablet Take 1 tablet by mouth daily.     No current facility-administered medications for this encounter.     Physical Findings:  height is 6\' 1"  (1.854 m) and weight is 231 lb 9.6 oz (105.1 kg). His oral temperature is 98.2 F (36.8 C). His blood pressure is 124/77 and his pulse is 77. His respiration is 20 and oxygen saturation is 95%.  Pain Assessment Pain Score: 0-No pain/10 In general this is a well appearing Caucasian male in no acute distress.  He's alert and oriented x4 and appropriate throughout the examination. Cardiopulmonary assessment is negative for acute distress and he exhibits normal effort.   Lab Findings: Lab Results  Component Value Date   WBC 6.1 06/17/2017   HGB 13.3 06/25/2017   HCT 40.2 06/25/2017   MCV 82.2 06/17/2017   PLT 261 06/17/2017     Radiographic Findings: No results found.  Impression/Plan: 1. 57 y.o. 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   He will continue to follow up with urology for ongoing PSA determinations and has an appointment scheduled with Dr. Alinda Money on 04/23/17. He understands what to expect with regards to PSA monitoring going forward. I will  look forward to following his response to treatment via correspondence with urology, and would be happy to continue to participate in his care if clinically indicated. I talked to the patient about what to expect in the future, including his risk for erectile dysfunction and rectal bleeding. I encouraged him to call or return to the office if he has any questions regarding his previous radiation or possible radiation side effects. He was comfortable with this plan and will follow up as needed.    Nicholos Johns,  PA-C

## 2018-01-28 ENCOUNTER — Ambulatory Visit: Payer: Self-pay | Admitting: Urology

## 2018-04-11 DIAGNOSIS — C61 Malignant neoplasm of prostate: Secondary | ICD-10-CM | POA: Diagnosis not present

## 2018-04-23 DIAGNOSIS — C61 Malignant neoplasm of prostate: Secondary | ICD-10-CM | POA: Diagnosis not present

## 2018-04-23 DIAGNOSIS — N393 Stress incontinence (female) (male): Secondary | ICD-10-CM | POA: Diagnosis not present

## 2018-04-23 DIAGNOSIS — N5201 Erectile dysfunction due to arterial insufficiency: Secondary | ICD-10-CM | POA: Diagnosis not present

## 2018-04-25 DIAGNOSIS — Z0000 Encounter for general adult medical examination without abnormal findings: Secondary | ICD-10-CM | POA: Diagnosis not present

## 2018-04-25 DIAGNOSIS — E559 Vitamin D deficiency, unspecified: Secondary | ICD-10-CM | POA: Diagnosis not present

## 2018-04-25 DIAGNOSIS — E782 Mixed hyperlipidemia: Secondary | ICD-10-CM | POA: Diagnosis not present

## 2018-06-06 DIAGNOSIS — S7002XA Contusion of left hip, initial encounter: Secondary | ICD-10-CM | POA: Diagnosis not present

## 2018-11-14 DIAGNOSIS — C61 Malignant neoplasm of prostate: Secondary | ICD-10-CM | POA: Diagnosis not present

## 2018-11-21 DIAGNOSIS — C61 Malignant neoplasm of prostate: Secondary | ICD-10-CM | POA: Diagnosis not present

## 2018-11-21 DIAGNOSIS — N5201 Erectile dysfunction due to arterial insufficiency: Secondary | ICD-10-CM | POA: Diagnosis not present

## 2018-12-12 DIAGNOSIS — M25561 Pain in right knee: Secondary | ICD-10-CM | POA: Diagnosis not present

## 2019-03-04 DIAGNOSIS — D225 Melanocytic nevi of trunk: Secondary | ICD-10-CM | POA: Diagnosis not present

## 2019-03-04 DIAGNOSIS — Z86018 Personal history of other benign neoplasm: Secondary | ICD-10-CM | POA: Diagnosis not present

## 2019-03-04 DIAGNOSIS — D485 Neoplasm of uncertain behavior of skin: Secondary | ICD-10-CM | POA: Diagnosis not present

## 2019-03-04 DIAGNOSIS — L814 Other melanin hyperpigmentation: Secondary | ICD-10-CM | POA: Diagnosis not present

## 2019-03-04 DIAGNOSIS — L821 Other seborrheic keratosis: Secondary | ICD-10-CM | POA: Diagnosis not present

## 2019-04-29 DIAGNOSIS — C61 Malignant neoplasm of prostate: Secondary | ICD-10-CM | POA: Diagnosis not present

## 2019-05-06 DIAGNOSIS — C61 Malignant neoplasm of prostate: Secondary | ICD-10-CM | POA: Diagnosis not present

## 2019-05-06 DIAGNOSIS — N5201 Erectile dysfunction due to arterial insufficiency: Secondary | ICD-10-CM | POA: Diagnosis not present

## 2019-06-12 DIAGNOSIS — E782 Mixed hyperlipidemia: Secondary | ICD-10-CM | POA: Diagnosis not present

## 2019-06-12 DIAGNOSIS — E559 Vitamin D deficiency, unspecified: Secondary | ICD-10-CM | POA: Diagnosis not present

## 2019-06-12 DIAGNOSIS — Z0000 Encounter for general adult medical examination without abnormal findings: Secondary | ICD-10-CM | POA: Diagnosis not present

## 2019-07-29 DIAGNOSIS — C61 Malignant neoplasm of prostate: Secondary | ICD-10-CM | POA: Diagnosis not present

## 2019-08-05 DIAGNOSIS — C61 Malignant neoplasm of prostate: Secondary | ICD-10-CM | POA: Diagnosis not present

## 2019-08-11 ENCOUNTER — Other Ambulatory Visit (HOSPITAL_COMMUNITY): Payer: Self-pay | Admitting: Urology

## 2019-08-11 DIAGNOSIS — C61 Malignant neoplasm of prostate: Secondary | ICD-10-CM

## 2019-08-27 ENCOUNTER — Ambulatory Visit (HOSPITAL_COMMUNITY)
Admission: RE | Admit: 2019-08-27 | Discharge: 2019-08-27 | Disposition: A | Discharge status: Home / Self Care | Payer: BC Managed Care – PPO | Source: Ambulatory Visit | Attending: Urology | Admitting: Urology

## 2019-08-27 ENCOUNTER — Other Ambulatory Visit: Payer: Self-pay

## 2019-08-27 DIAGNOSIS — C61 Malignant neoplasm of prostate: Secondary | ICD-10-CM | POA: Diagnosis not present

## 2019-08-27 MED ORDER — AXUMIN (FLUCICLOVINE F 18) INJECTION
9.2000 | Freq: Once | INTRAVENOUS | Status: AC
  Administered 2019-08-27: 9.2 via INTRAVENOUS

## 2019-09-02 ENCOUNTER — Encounter: Payer: Self-pay | Admitting: Radiation Oncology

## 2019-09-02 NOTE — Progress Notes (Signed)
Histology and Location of Primary Cancer: Prostate cancer s/p UNS RAL radical prostatectomy and BPLND on 06/24/2017. He completed adjuvant radiation therapy from August to October 2019 under the care of Dr. Tammi Klippel.   DX pT3b N0Mx, Gleason 4+3=7 with extensive EPE into the EP tissue on the right, bladder neck and SV and 2 mm positive surgical margin at the right bladder neck.  Location(s) of Symptomatic tumor(s): Rapid rise in PSA from 1.4 to greater than 4. PET revealed concerning right external iliac lymph node.  Past/Anticipated chemotherapy by medical oncology, if any: no  Patient's main complaints related to symptomatic tumor(s) are: denies  Pain on a scale of 0-10 is: denies    If Spine Met(s), symptoms, if any, include:  Bowel/Bladder retention or incontinence (please describe): Reports scant urinary leakage with certain movements. Denies urinary incontinence or retention. Denies any bowel complaints.   Numbness or weakness in extremities (please describe): denies  Current Decadron regimen, if applicable: denies  Ambulatory status? Walker? Wheelchair?: ambulatory  SAFETY ISSUES:  Prior radiation? yes  Pacemaker/ICD? denies  Possible current pregnancy? No, male patient  Is the patient on methotrexate? denies  Additional Complaints / other details:  59 year old male. Married.  Questions about possible lymphoma on right side???

## 2019-09-04 ENCOUNTER — Other Ambulatory Visit: Payer: Self-pay

## 2019-09-04 ENCOUNTER — Encounter: Payer: Self-pay | Admitting: Radiation Oncology

## 2019-09-04 ENCOUNTER — Ambulatory Visit
Admission: RE | Admit: 2019-09-04 | Discharge: 2019-09-04 | Disposition: A | Discharge status: Home / Self Care | Payer: BC Managed Care – PPO | Source: Ambulatory Visit | Attending: Radiation Oncology | Admitting: Radiation Oncology

## 2019-09-04 VITALS — BP 122/89 | HR 76 | Temp 98.0°F | Resp 20 | Ht 73.0 in | Wt 217.0 lb

## 2019-09-04 DIAGNOSIS — Z923 Personal history of irradiation: Secondary | ICD-10-CM | POA: Insufficient documentation

## 2019-09-04 DIAGNOSIS — C61 Malignant neoplasm of prostate: Secondary | ICD-10-CM

## 2019-09-04 DIAGNOSIS — Z510 Encounter for antineoplastic radiation therapy: Secondary | ICD-10-CM | POA: Diagnosis not present

## 2019-09-04 DIAGNOSIS — Z79899 Other long term (current) drug therapy: Secondary | ICD-10-CM | POA: Insufficient documentation

## 2019-09-04 DIAGNOSIS — Z9079 Acquired absence of other genital organ(s): Secondary | ICD-10-CM | POA: Diagnosis not present

## 2019-09-04 DIAGNOSIS — C775 Secondary and unspecified malignant neoplasm of intrapelvic lymph nodes: Secondary | ICD-10-CM | POA: Diagnosis not present

## 2019-09-04 DIAGNOSIS — R9721 Rising PSA following treatment for malignant neoplasm of prostate: Secondary | ICD-10-CM | POA: Diagnosis not present

## 2019-09-04 NOTE — Progress Notes (Signed)
Radiation Oncology         [phone] ________________________________  Name: Barry Nash MRN: [id number]  Date: 09/04/2019  DOB: June 30, 1960  Follow Up New  CC: [name], MD  Raynelle Bring, MD  Diagnosis:   59 y.o. 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 In summary, he was initially diagnosed with Gleason 4+3 prostate cancer on 04/24/2017. He opted to proceed with prostatectomy on 06/24/2017 under Dr. Alinda Money. Surgical pathology revealed a pT3bN0 adenocarcinoma of the prostate, Gleason 4+3 with evidence of extraprostatic extension, seminal vesicle involvement on the right, lymphovascular involvement and a positive margin at the bladder neck.  There was no lymph node involvement. I met with the patient on 10/18/2017 after his initial postoperative PSA was detectable at 0.049. He opted to proceed with salvage radiation therapy at that time.  More recently, he was noted to have a rapid rise in PSA, from 1.4 to greater than 4. This prompted a PET scan. Performed on 08/27/2019, the PET scan showed: 2 cm right external iliac lymph node with intense radiotracer uptake; no other sites of metastatic disease.  On review of systems, this is documented in the nursing intake and was reviewed by me.  ALLERGIES:  is allergic to penicillins.  Meds: Current Outpatient Medications  Medication Sig Dispense Refill  . Cholecalciferol (VITAMIN D3) 1000 units CAPS Take by mouth.    . co-enzyme Q-10 30 MG capsule Take 30 mg by mouth 3 (three) times daily.    Marland Kitchen lactobacillus acidophilus (BACID) TABS tablet Take 2 tablets by mouth 3 (three) times daily. Probiotic taking two capsules daily    .  magnesium gluconate (MAGONATE) 500 MG tablet Take 500 mg by mouth 2 (two) times daily. Taking 2 tablets daily po    . Multiple Vitamin (MULTIVITAMIN) tablet Take 1 tablet by mouth daily.     No current facility-administered medications for this encounter.    Physical Findings:  height is 6' 1"  (1.854 m) and weight is 217 lb (98.4 kg). His temperature is 98 F (36.7 C). His blood pressure is 122/89 and his pulse is 76. His respiration is 20 and oxygen saturation is 98%.  Pain Assessment Pain Score: 0-No pain In general this is a well appearing Caucasian male in no acute distress.  He's alert and oriented x4 and appropriate throughout the examination. Cardiopulmonary assessment is negative for acute distress and he exhibits normal effort.   Lab Findings: Lab Results  Component Value Date   WBC 6.1 06/17/2017   HGB 13.3 06/25/2017   HCT 40.2 06/25/2017   MCV 82.2 06/17/2017   PLT 261 06/17/2017     Radiographic Findings: NM PET (AXUMIN) SKULL BASE TO MID THIGH  Result Date: 08/31/2019 CLINICAL DATA:  Prostate carcinoma with biochemical recurrence. EXAM: NUCLEAR MEDICINE PET SKULL BASE TO THIGH TECHNIQUE: 9.2 mCi F-18 Fluciclovine was injected intravenously. Full-ring PET imaging was performed from the skull base to thigh after the radiotracer. CT data was obtained and used for attenuation correction and anatomic localization. COMPARISON:  Pelvis CT on 05/01/2017 FINDINGS: NECK No radiotracer activity in neck lymph nodes. Incidental CT finding: None CHEST No radiotracer accumulation within mediastinal or hilar lymph nodes. No  suspicious pulmonary nodules on the CT scan. Incidental CT finding: None ABDOMEN/PELVIS Prostate: No focal activity in the prostate bed. Lymph nodes: Postop changes from prostatectomy. No evidence of soft tissue mass within the prostatectomy bed. A 2.0 cm lymph node in the proximal right external iliac chain on image 173/4 is new since previous study, and shows intense  radiotracer uptake, with SUV max of 7.6. No other sites of lymph node uptake identified. Liver: No evidence of liver metastasis. Incidental CT finding: None SKELETON No focal activity to suggest skeletal metastasis. IMPRESSION: 2 cm right external iliac lymph node shows intense radiotracer uptake, consistent with lymph node metastasis. No other sites of metastatic disease identified. Electronically Signed   By: Marlaine Hind M.D.   On: 08/31/2019 09:36    Impression/Plan: 1.  59 y.o. man with an isolated oligometastatic right iliac lymph node fromadenocarcinoma of the prostate with Gleason Score of 4+3, andpost-prostatectomy PSA of 0.049.  He is eligible for SBRT salvage based on the COMET-SABR Trial (https://www.thelancet.com/journals/lancet/article/PIIS0140-6736(18)32487-5/fulltext)  Today, I talked to the patient and family about the findings and work-up thus far.  We discussed the natural history of oligometastatic prostate cancer and general treatment, highlighting the role of SBRT radiotherapy in the management.  We discussed the available radiation techniques, and focused on the details of logistics and delivery.  We reviewed the anticipated acute and late sequelae associated with radiation in this setting.  The patient was encouraged to ask questions that I answered to the best of my ability.   The patient would like to proceed with radiation and will be scheduled for CT simulation.  I spent 45 minutes minutes total in the encounter with chart review, films review, face to face meeting, exam, orders and documentation.  ------------------------------------------------   Tyler Pita, MD [place] Director and Director of Stereotactic Radiosurgery Direct Dial: [phone]  Fax: [phone] [email or web address]  Skype  [email or web address]   This document serves as a record of services personally performed by Tyler Pita, MD. It was created on his behalf by Wilburn Mylar, a trained medical scribe. The creation of this record is based on the scribe's personal observations and the provider's statements to them. This document has been checked and approved by the attending provider.

## 2019-09-11 NOTE — Progress Notes (Signed)
  Radiation Oncology         [phone] ________________________________  Name: Barry Nash MRN: [id number]  Date: 09/04/2019  DOB: 19-Jul-1960  STEREOTACTIC BODY RADIOTHERAPY SIMULATION AND TREATMENT PLANNING NOTE    ICD-10-CM   1. Malignant neoplasm of prostate ([place])  C61     DIAGNOSIS:  59 y.o. man with an isolated oligometastatic right iliac lymph node fromadenocarcinoma of the prostate with Gleason Score of 4+3, andpost-prostatectomy PSA of 0.049   NARRATIVE:  The patient was brought to the [facility].  Identity was confirmed.  All relevant records and images related to the planned course of therapy were reviewed.  The patient freely provided informed written consent to proceed with treatment after reviewing the details related to the planned course of therapy. The consent form was witnessed and verified by the simulation staff.  Then, the patient was set-up in a stable reproducible  supine position for radiation therapy.  A BodyFix immobilization pillow was fabricated for reproducible positioning.  Surface markings were placed.  The CT images were loaded into the planning software.  The gross target volumes (GTV) and planning target volumes (PTV) were delinieated, and avoidance structures were contoured.  Treatment planning then occurred.  The radiation prescription was entered and confirmed.  A total of two complex treatment devices were fabricated in the form of the BodyFix immobilization pillow and a neck accuform cushion.  I have requested : 3D Simulation  I have requested a DVH of the following structures: targets and all normal structures near the target including bladder, rectum, bowel and target as noted on the radiation plan to maintain doses in adherence with established limits  PLAN:  The patient will receive 40 Gy in 5 fractions.  ________________________________  Sheral Apley Tammi Klippel, M.D.

## 2019-09-11 NOTE — Progress Notes (Signed)
  Radiation Oncology         [phone] ________________________________  Name: Barry Nash MRN: [id number]  Date: 09/04/2019  DOB: 06-30-1960  SPECIAL TREATMENT PROCEDURE NOTE  NARRATIVE:  The planned course of therapy using radiation constitutes a special treatment procedure. Special care is required in the management of this patient for the following reasons.  I have requested : This treatment constitutes a Special Treatment Procedure for the following reason: [ High dose per fraction requiring special monitoring for increased toxicities of treatment including daily imaging. Also the treatment constitutes a Special Treatment Procedure for the following reason: [ Retreatment in a previously radiated area requiring careful monitoring of increased risk of toxicity due to overlap of previous treatment..  The special nature of the planned course of radiotherapy will require increased physician supervision and oversight to ensure patient's safety with optimal treatment outcomes. .   The special nature of the planned course of radiotherapy will require increased physician supervision and oversight to ensure patient's safety with optimal treatment outcomes. ________________________________  Sheral Apley Tammi Klippel, M.D.

## 2019-09-14 ENCOUNTER — Ambulatory Visit: Payer: BC Managed Care – PPO

## 2019-09-15 ENCOUNTER — Other Ambulatory Visit: Payer: Self-pay

## 2019-09-15 ENCOUNTER — Ambulatory Visit
Admission: RE | Admit: 2019-09-15 | Discharge: 2019-09-15 | Disposition: A | Discharge status: Home / Self Care | Payer: BC Managed Care – PPO | Source: Ambulatory Visit | Attending: Radiation Oncology | Admitting: Radiation Oncology

## 2019-09-15 ENCOUNTER — Ambulatory Visit: Payer: BC Managed Care – PPO

## 2019-09-15 DIAGNOSIS — C775 Secondary and unspecified malignant neoplasm of intrapelvic lymph nodes: Secondary | ICD-10-CM | POA: Diagnosis not present

## 2019-09-15 DIAGNOSIS — Z9079 Acquired absence of other genital organ(s): Secondary | ICD-10-CM | POA: Diagnosis not present

## 2019-09-15 DIAGNOSIS — C61 Malignant neoplasm of prostate: Secondary | ICD-10-CM | POA: Diagnosis not present

## 2019-09-15 DIAGNOSIS — Z510 Encounter for antineoplastic radiation therapy: Secondary | ICD-10-CM | POA: Diagnosis not present

## 2019-09-16 ENCOUNTER — Ambulatory Visit: Payer: BC Managed Care – PPO

## 2019-09-17 ENCOUNTER — Ambulatory Visit: Payer: BC Managed Care – PPO

## 2019-09-17 ENCOUNTER — Other Ambulatory Visit: Payer: Self-pay

## 2019-09-17 ENCOUNTER — Ambulatory Visit
Admission: RE | Admit: 2019-09-17 | Discharge: 2019-09-17 | Disposition: A | Discharge status: Home / Self Care | Payer: BC Managed Care – PPO | Source: Ambulatory Visit | Attending: Radiation Oncology | Admitting: Radiation Oncology

## 2019-09-17 DIAGNOSIS — C61 Malignant neoplasm of prostate: Secondary | ICD-10-CM | POA: Diagnosis not present

## 2019-09-17 DIAGNOSIS — C775 Secondary and unspecified malignant neoplasm of intrapelvic lymph nodes: Secondary | ICD-10-CM | POA: Diagnosis not present

## 2019-09-17 DIAGNOSIS — Z510 Encounter for antineoplastic radiation therapy: Secondary | ICD-10-CM | POA: Diagnosis not present

## 2019-09-17 DIAGNOSIS — Z9079 Acquired absence of other genital organ(s): Secondary | ICD-10-CM | POA: Diagnosis not present

## 2019-09-18 ENCOUNTER — Ambulatory Visit: Payer: BC Managed Care – PPO

## 2019-09-21 ENCOUNTER — Ambulatory Visit: Payer: BC Managed Care – PPO

## 2019-09-22 ENCOUNTER — Other Ambulatory Visit: Payer: Self-pay

## 2019-09-22 ENCOUNTER — Ambulatory Visit
Admission: RE | Admit: 2019-09-22 | Discharge: 2019-09-22 | Disposition: A | Discharge status: Home / Self Care | Payer: BC Managed Care – PPO | Source: Ambulatory Visit | Attending: Radiation Oncology | Admitting: Radiation Oncology

## 2019-09-22 ENCOUNTER — Encounter: Payer: Self-pay | Admitting: Medical Oncology

## 2019-09-22 DIAGNOSIS — Z9079 Acquired absence of other genital organ(s): Secondary | ICD-10-CM | POA: Diagnosis not present

## 2019-09-22 DIAGNOSIS — C61 Malignant neoplasm of prostate: Secondary | ICD-10-CM | POA: Diagnosis not present

## 2019-09-22 DIAGNOSIS — C775 Secondary and unspecified malignant neoplasm of intrapelvic lymph nodes: Secondary | ICD-10-CM | POA: Diagnosis not present

## 2019-09-22 DIAGNOSIS — Z510 Encounter for antineoplastic radiation therapy: Secondary | ICD-10-CM | POA: Diagnosis not present

## 2019-09-23 ENCOUNTER — Ambulatory Visit: Payer: BC Managed Care – PPO

## 2019-09-24 ENCOUNTER — Ambulatory Visit
Admission: RE | Admit: 2019-09-24 | Discharge: 2019-09-24 | Disposition: A | Discharge status: Home / Self Care | Payer: BC Managed Care – PPO | Source: Ambulatory Visit | Attending: Radiation Oncology | Admitting: Radiation Oncology

## 2019-09-24 ENCOUNTER — Other Ambulatory Visit: Payer: Self-pay

## 2019-09-24 DIAGNOSIS — C61 Malignant neoplasm of prostate: Secondary | ICD-10-CM | POA: Diagnosis not present

## 2019-09-24 DIAGNOSIS — Z510 Encounter for antineoplastic radiation therapy: Secondary | ICD-10-CM | POA: Insufficient documentation

## 2019-09-24 DIAGNOSIS — C775 Secondary and unspecified malignant neoplasm of intrapelvic lymph nodes: Secondary | ICD-10-CM | POA: Diagnosis not present

## 2019-09-24 DIAGNOSIS — Z9079 Acquired absence of other genital organ(s): Secondary | ICD-10-CM | POA: Diagnosis not present

## 2019-09-25 ENCOUNTER — Ambulatory Visit: Payer: BC Managed Care – PPO

## 2019-09-29 ENCOUNTER — Ambulatory Visit
Admission: RE | Admit: 2019-09-29 | Discharge: 2019-09-29 | Disposition: A | Discharge status: Home / Self Care | Payer: BC Managed Care – PPO | Source: Ambulatory Visit | Attending: Radiation Oncology | Admitting: Radiation Oncology

## 2019-09-29 ENCOUNTER — Encounter: Payer: Self-pay | Admitting: Medical Oncology

## 2019-09-29 ENCOUNTER — Other Ambulatory Visit: Payer: Self-pay

## 2019-09-29 ENCOUNTER — Encounter: Payer: Self-pay | Admitting: Radiation Oncology

## 2019-09-29 DIAGNOSIS — Z510 Encounter for antineoplastic radiation therapy: Secondary | ICD-10-CM | POA: Diagnosis not present

## 2019-09-29 DIAGNOSIS — C61 Malignant neoplasm of prostate: Secondary | ICD-10-CM | POA: Diagnosis not present

## 2019-09-29 DIAGNOSIS — C775 Secondary and unspecified malignant neoplasm of intrapelvic lymph nodes: Secondary | ICD-10-CM | POA: Diagnosis not present

## 2019-09-29 DIAGNOSIS — Z9079 Acquired absence of other genital organ(s): Secondary | ICD-10-CM | POA: Diagnosis not present

## 2019-10-13 DIAGNOSIS — Z1211 Encounter for screening for malignant neoplasm of colon: Secondary | ICD-10-CM | POA: Diagnosis not present

## 2019-11-02 ENCOUNTER — Encounter: Payer: Self-pay | Admitting: Urology

## 2019-11-02 ENCOUNTER — Telehealth: Payer: Self-pay

## 2019-11-02 ENCOUNTER — Other Ambulatory Visit: Payer: Self-pay

## 2019-11-02 NOTE — Progress Notes (Signed)
Patient has 1 month telephone follow up with Ashlyn Bruning PA. Patient denies having dysuria or hematuria. Patient states nocturia 1 time per night. Patient states urine stream is moderate and steady. Patient states that he is emptying his bladder completely. Patient denies having urgency, hesitancy or straining. Patient states occasional leakage with activity. Patient denies fatigue. Patient states having a follow-up appointment with [facility] in September.

## 2019-11-02 NOTE — Telephone Encounter (Signed)
Spoke with patient in regards to 1 month telephone follow-up appointment with Freeman Caldron PA on 11/04/19 at 2:00pm. Patient verbalized understanding of appointment date and time. Meaningful use, AUA and prostate questions were reviewed. TM

## 2019-11-04 ENCOUNTER — Other Ambulatory Visit: Payer: Self-pay

## 2019-11-04 ENCOUNTER — Ambulatory Visit
Admission: RE | Admit: 2019-11-04 | Discharge: 2019-11-04 | Disposition: A | Discharge status: Home / Self Care | Payer: BC Managed Care – PPO | Source: Ambulatory Visit | Attending: Urology | Admitting: Urology

## 2019-11-04 DIAGNOSIS — C775 Secondary and unspecified malignant neoplasm of intrapelvic lymph nodes: Secondary | ICD-10-CM

## 2019-11-04 DIAGNOSIS — C61 Malignant neoplasm of prostate: Secondary | ICD-10-CM | POA: Insufficient documentation

## 2019-11-04 NOTE — Progress Notes (Signed)
  Radiation Oncology         [phone] ________________________________  Name: Barry Nash MRN: [id number]  Date: 11/04/2019  DOB: 10/27/1960  Post Treatment Note  CC: [name], MD  [name], MD  Diagnosis:   59 y.o.man with anisolated oligometastatic right iliac lymph node fromadenocarcinoma of the prostate with Gleason Score of 4+3, andpost-prostatectomy PSA of 0.049  Interval Since Last Radiation:  5 weeks   09/15/19 - 09/29/19: The involved right iliac lymph node was treated to 40 Gy in 5 fractions of 8 Gy each.  11/04/2017 - 12/27/2017:The prostatic fossa was treated to 68.4 Gy in 38 fractions of 1.8 Gy  Narrative:  I spoke with the patient to conduct his routine scheduled 1 month follow up visit via telephone to spare the patient unnecessary potential exposure in the healthcare setting during the current COVID-19 pandemic.  The patient was notified in advance and gave permission to proceed with this visit format.  He tolerated the radiation treatments well without ill side effects.                              On review of systems, the patient states that he is doing very well in general and continues without complaints. He reports good energy level, healthy appetite and no change in LUTS. Overall, he is quite pleased with his progress to date.  His daughter, [name], has just started PA school at [employer] 2 weeks ago.  ALLERGIES:  is allergic to penicillins.  Meds: Current Outpatient Medications  Medication Sig Dispense Refill  . Cholecalciferol (VITAMIN D3) 1000 units CAPS Take by mouth.    . co-enzyme Q-10 30 MG capsule Take 30 mg by mouth 3 (three) times daily.    Marland Kitchen lactobacillus acidophilus (BACID) TABS tablet Take 2 tablets by mouth 3 (three) times daily. Probiotic taking two capsules daily    . magnesium gluconate (MAGONATE) 500 MG tablet Take 500 mg by mouth 2 (two) times daily. Taking 2 tablets daily po    . Multiple Vitamin (MULTIVITAMIN) tablet  Take 1 tablet by mouth daily.     No current facility-administered medications for this encounter.    Physical Findings:  vitals were not taken for this visit.   /Unable to assess due to telephone follow up visit format.  Lab Findings: Lab Results  Component Value Date   WBC 6.1 06/17/2017   HGB 13.3 06/25/2017   HCT 40.2 06/25/2017   MCV 82.2 06/17/2017   PLT 261 06/17/2017     Radiographic Findings: No results found.  Impression/Plan: 1. 59 y.o.man with anisolated oligometastatic right iliac lymph node fromadenocarcinoma of the prostate with Gleason Score of 4+3, andpost-prostatectomy PSA of 0.049. He appears to have recovered well from the effects of his recent radiation and is currently without complaints.  We discussed that while we are happy to continue to participate in his care if clinically indicated, at this point, we will plan to see him back on an as-needed basis.  He will continue his routine follow-up under the care and direction of Dr. Alinda Money but knows that he is welcome to call us at anytime with any questions or concerns related to his previous radiotherapy.    Barry Johns, PA-C

## 2019-11-18 DIAGNOSIS — Z1211 Encounter for screening for malignant neoplasm of colon: Secondary | ICD-10-CM | POA: Diagnosis not present

## 2019-11-18 DIAGNOSIS — K5730 Diverticulosis of large intestine without perforation or abscess without bleeding: Secondary | ICD-10-CM | POA: Diagnosis not present

## 2019-11-19 DIAGNOSIS — C61 Malignant neoplasm of prostate: Secondary | ICD-10-CM | POA: Diagnosis not present

## 2019-12-03 DIAGNOSIS — C61 Malignant neoplasm of prostate: Secondary | ICD-10-CM | POA: Diagnosis not present

## 2020-03-10 DIAGNOSIS — C61 Malignant neoplasm of prostate: Secondary | ICD-10-CM | POA: Diagnosis not present

## 2020-03-23 DIAGNOSIS — L570 Actinic keratosis: Secondary | ICD-10-CM | POA: Diagnosis not present

## 2020-03-23 DIAGNOSIS — D225 Melanocytic nevi of trunk: Secondary | ICD-10-CM | POA: Diagnosis not present

## 2020-03-23 DIAGNOSIS — L821 Other seborrheic keratosis: Secondary | ICD-10-CM | POA: Diagnosis not present

## 2020-03-23 DIAGNOSIS — Z86018 Personal history of other benign neoplasm: Secondary | ICD-10-CM | POA: Diagnosis not present

## 2020-03-23 DIAGNOSIS — L814 Other melanin hyperpigmentation: Secondary | ICD-10-CM | POA: Diagnosis not present

## 2020-03-26 NOTE — Progress Notes (Signed)
  Radiation Oncology         [phone] ________________________________  Name: Barry Nash MRN: [id number]  Date: 09/29/2019  DOB: Jan 15, 1961  End of Treatment Note  Diagnosis:     60 y.o. man with an isolated oligometastatic right iliac lymph node fromadenocarcinoma of the prostate      Indication for treatment:  Curative SBRT       Radiation treatment dates:   6/22-09/29/19  Site/dose:   The 2 cm right external iliac lymph node was treated to 40 Gy in 5 fractions of 8 Gy  Beams/energy:   3 VMAT RapidArc Beams were used with 6 MV  Narrative: The patient tolerated radiation treatment relatively well.     Plan: The patient has completed radiation treatment. The patient will return to radiation oncology clinic for routine followup in one month. I advised him to call or return sooner if he has any questions or concerns related to his recovery or treatment. ________________________________  Artist Pais. Kathrynn Running, M.D.

## 2021-03-28 DIAGNOSIS — L821 Other seborrheic keratosis: Secondary | ICD-10-CM | POA: Diagnosis not present

## 2021-03-28 DIAGNOSIS — Z86018 Personal history of other benign neoplasm: Secondary | ICD-10-CM | POA: Diagnosis not present

## 2021-03-28 DIAGNOSIS — L814 Other melanin hyperpigmentation: Secondary | ICD-10-CM | POA: Diagnosis not present

## 2021-03-28 DIAGNOSIS — D225 Melanocytic nevi of trunk: Secondary | ICD-10-CM | POA: Diagnosis not present

## 2021-03-28 DIAGNOSIS — D485 Neoplasm of uncertain behavior of skin: Secondary | ICD-10-CM | POA: Diagnosis not present

## 2021-03-31 DIAGNOSIS — C61 Malignant neoplasm of prostate: Secondary | ICD-10-CM | POA: Diagnosis not present

## 2021-07-08 IMAGING — CT NM PET SKULL BASE TO THIGH
7 series · 25 of 25 positions shown · non-contrast
Comparison: Pelvis CT on 05/01/2017

CLINICAL DATA: Prostate carcinoma with biochemical recurrence.

EXAM:
NUCLEAR MEDICINE PET SKULL BASE TO THIGH
TECHNIQUE: 9.2 mCi F-18 Fluciclovine was injected intravenously. Full-ring PET
imaging was performed from the skull base to thigh after the
radiotracer. CT data was obtained and used for attenuation
correction and anatomic localization.

[Series 3: pet sk_thigh ac · axial · 5.0mm · 4.07mm/px · z∈[+364,+1300]mm · 6 of 235 slices shown]
[im 1/235]
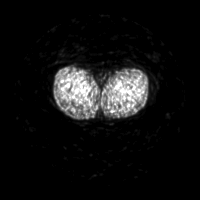
[im 47/235]
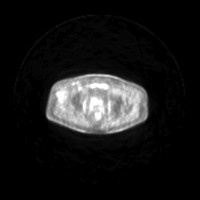
[im 94/235]
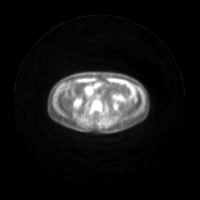
[im 141/235]
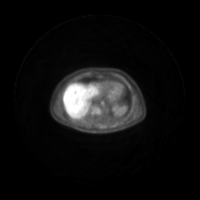
[im 188/235]
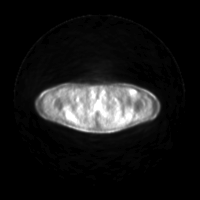
[im 235/235]
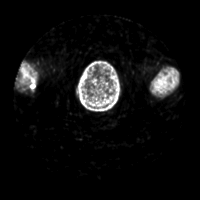

[Series 4: ct sk_thigh 5.0 b31f · axial · 5.0mm · 0.98mm/px · z∈[+364,+1300]mm · 5 of 235 slices shown]
[im 1/235]
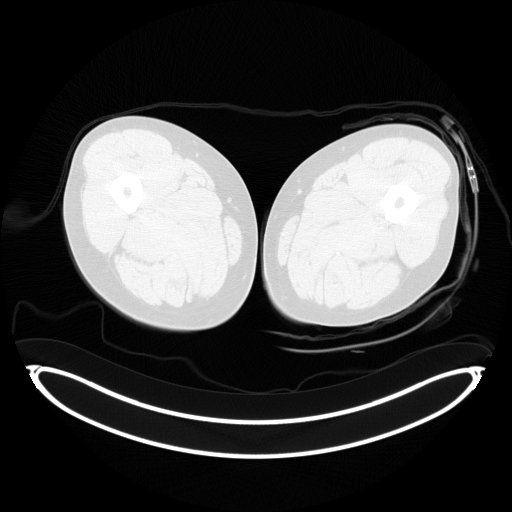
[im 59/235]
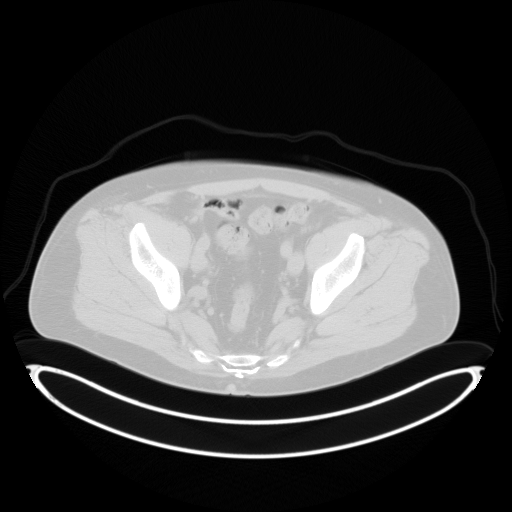
[im 118/235]
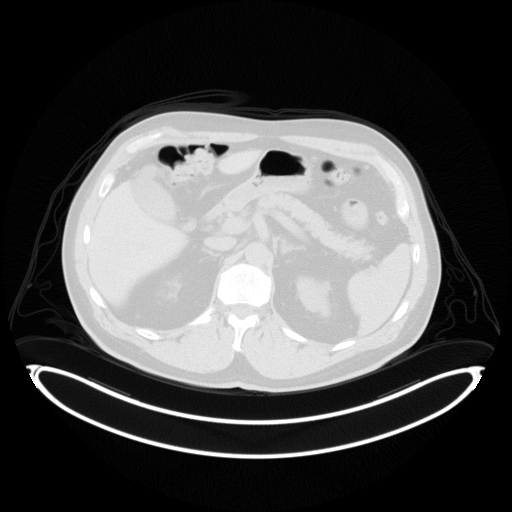
[im 176/235]
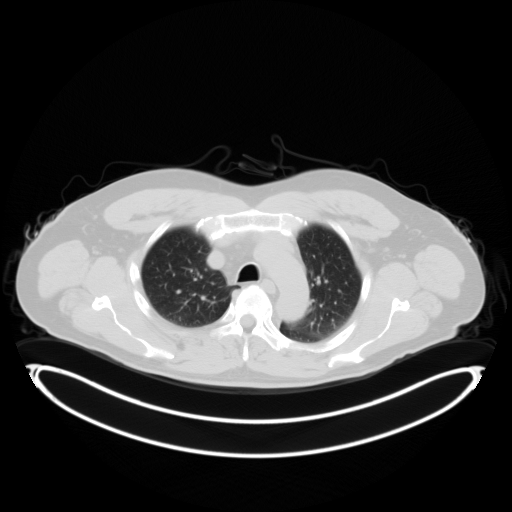
[im 235/235  brain]
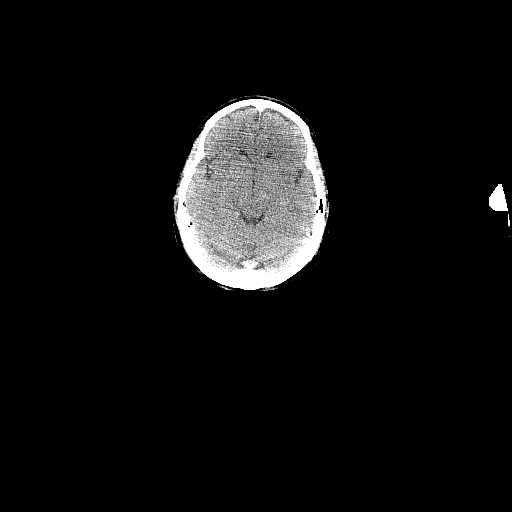

[Series 5: pet sk_thigh nac · axial · 5.0mm · 4.07mm/px · z∈[+364,+1300]mm · 5 of 235 slices shown]
[im 1/235]
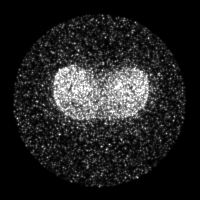
[im 59/235]
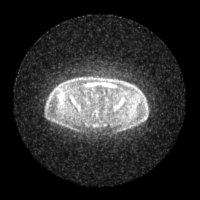
[im 118/235]
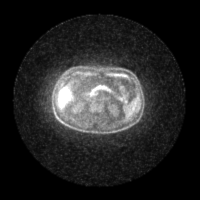
[im 176/235]
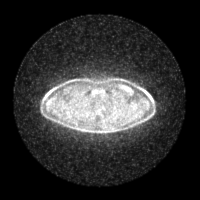
[im 235/235]
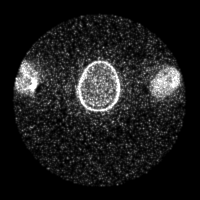

[Series 8: ct sk_thigh 5.0 b70f (id)_bone · axial · 5.0mm · 0.66mm/px · z∈[+874,+1130]mm · 2 of 65 slices shown]
[im 1/65  bone]
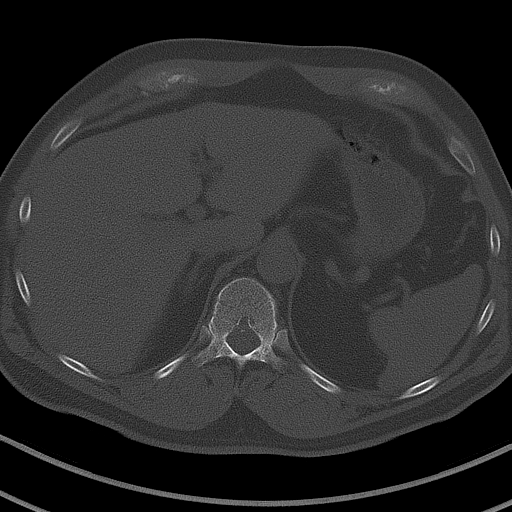
[im 65/65  bone]
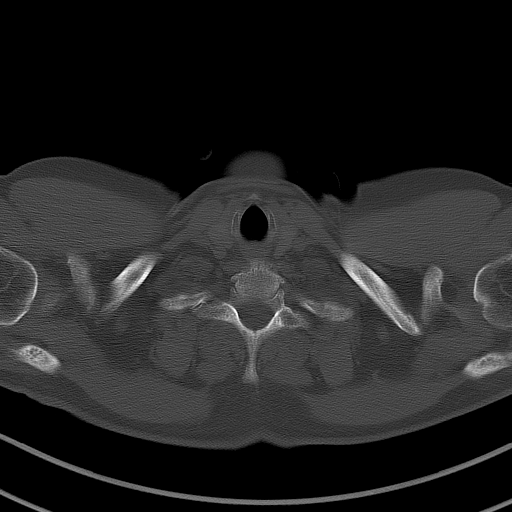

[Series 603: mip range 3 · coronal · 1.94mm/px · 1 of 32 slices shown]
[im 1/32]
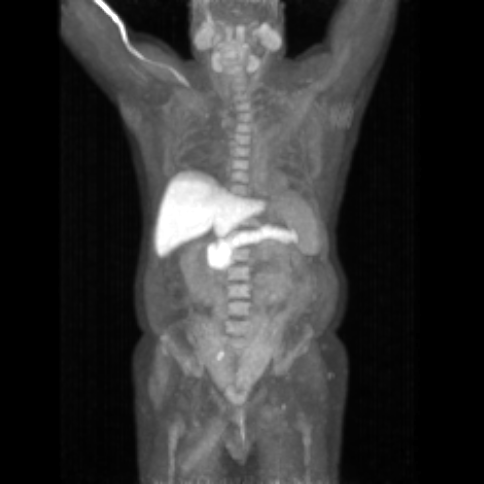

[Series 604: range-ct sk_thigh 5.0 (id)<alpha range> · 1 of 52 slices shown (1 of 2)]
[im 1/52]
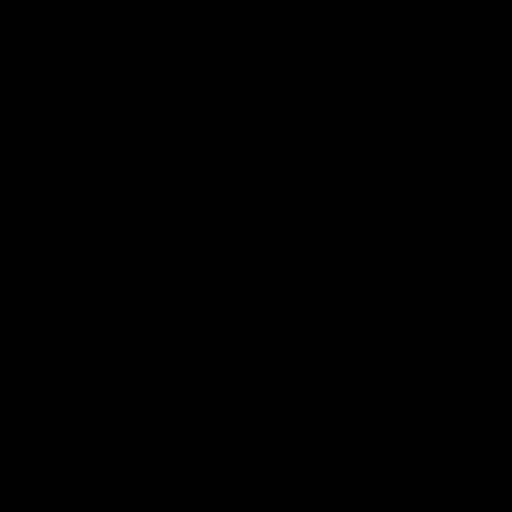

[Series 605: range-ct sk_thigh 5.0 (id)<alpha range> · 5 of 226 slices shown (2 of 2)]
[im 1/226]
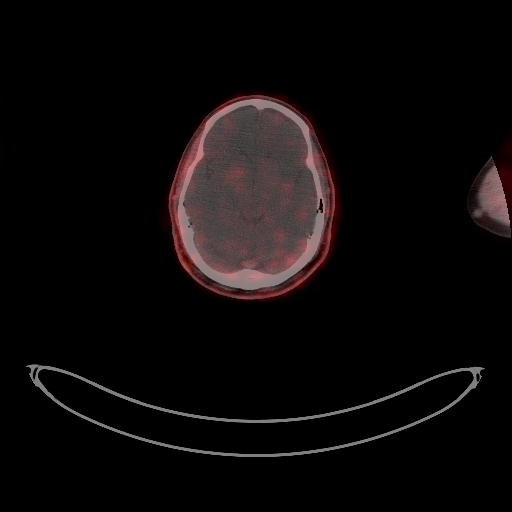
[im 57/226]
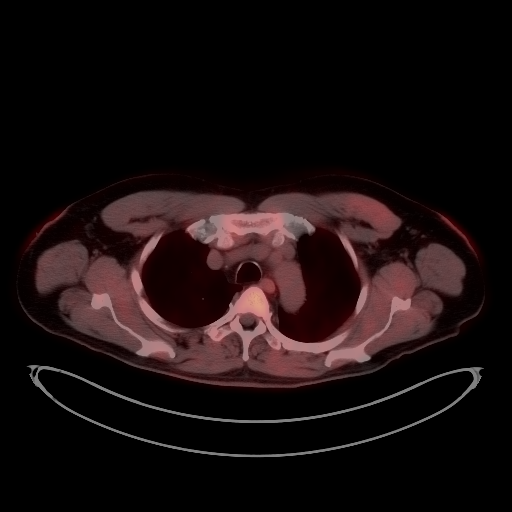
[im 113/226]
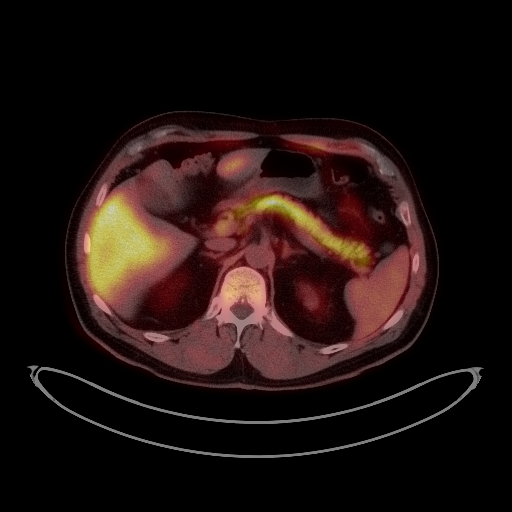
[im 169/226]
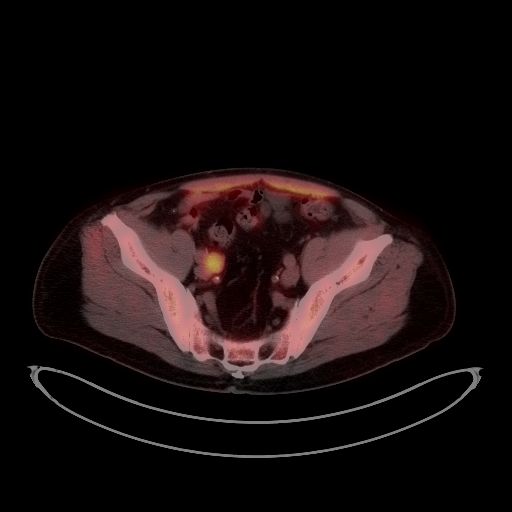
[im 226/226]
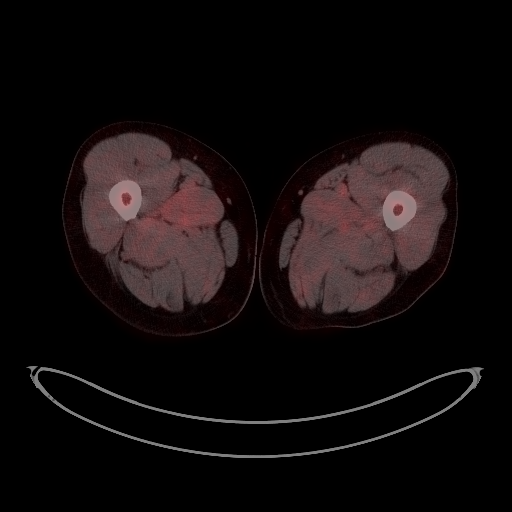

[25 of 25 positions shown; findings below may reference images not displayed]

FINDINGS: NECK

No radiotracer activity in neck lymph nodes.

Incidental CT finding: None

CHEST

No radiotracer accumulation within mediastinal or hilar lymph nodes.
No suspicious pulmonary nodules on the CT scan.

Incidental CT finding: None

ABDOMEN/PELVIS

Prostate: No focal activity in the prostate bed.

Lymph nodes: Postop changes from prostatectomy. No evidence of soft
tissue mass within the prostatectomy bed. A 2.0 cm lymph node in the
proximal right external iliac chain on image 173/4 is new since
previous study, and shows intense radiotracer uptake, with SUV max
of 7.6. No other sites of lymph node uptake identified.

Liver: No evidence of liver metastasis.

Incidental CT finding: None

SKELETON

No focal activity to suggest skeletal metastasis.
IMPRESSION: 2 cm right external iliac lymph node shows intense radiotracer
uptake, consistent with lymph node metastasis.

No other sites of metastatic disease identified.

## 2021-08-23 DIAGNOSIS — C61 Malignant neoplasm of prostate: Secondary | ICD-10-CM | POA: Diagnosis not present

## 2021-08-26 DIAGNOSIS — S01552A Open bite of oral cavity, initial encounter: Secondary | ICD-10-CM | POA: Diagnosis not present

## 2021-08-30 DIAGNOSIS — C61 Malignant neoplasm of prostate: Secondary | ICD-10-CM | POA: Diagnosis not present

## 2021-08-30 DIAGNOSIS — N5201 Erectile dysfunction due to arterial insufficiency: Secondary | ICD-10-CM | POA: Diagnosis not present

## 2021-11-24 DIAGNOSIS — C61 Malignant neoplasm of prostate: Secondary | ICD-10-CM | POA: Diagnosis not present

## 2021-12-05 DIAGNOSIS — Z0000 Encounter for general adult medical examination without abnormal findings: Secondary | ICD-10-CM | POA: Diagnosis not present

## 2021-12-05 DIAGNOSIS — E7800 Pure hypercholesterolemia, unspecified: Secondary | ICD-10-CM | POA: Diagnosis not present

## 2021-12-15 DIAGNOSIS — S92911A Unspecified fracture of right toe(s), initial encounter for closed fracture: Secondary | ICD-10-CM | POA: Diagnosis not present

## 2021-12-15 DIAGNOSIS — R030 Elevated blood-pressure reading, without diagnosis of hypertension: Secondary | ICD-10-CM | POA: Diagnosis not present

## 2021-12-15 DIAGNOSIS — S90121A Contusion of right lesser toe(s) without damage to nail, initial encounter: Secondary | ICD-10-CM | POA: Diagnosis not present

## 2021-12-15 DIAGNOSIS — M109 Gout, unspecified: Secondary | ICD-10-CM | POA: Diagnosis not present

## 2021-12-28 DIAGNOSIS — M79671 Pain in right foot: Secondary | ICD-10-CM | POA: Diagnosis not present

## 2022-01-11 DIAGNOSIS — M79671 Pain in right foot: Secondary | ICD-10-CM | POA: Diagnosis not present

## 2022-03-28 DIAGNOSIS — Z86018 Personal history of other benign neoplasm: Secondary | ICD-10-CM | POA: Diagnosis not present

## 2022-03-28 DIAGNOSIS — L814 Other melanin hyperpigmentation: Secondary | ICD-10-CM | POA: Diagnosis not present

## 2022-03-28 DIAGNOSIS — D225 Melanocytic nevi of trunk: Secondary | ICD-10-CM | POA: Diagnosis not present

## 2022-03-28 DIAGNOSIS — L821 Other seborrheic keratosis: Secondary | ICD-10-CM | POA: Diagnosis not present

## 2022-04-03 DIAGNOSIS — C61 Malignant neoplasm of prostate: Secondary | ICD-10-CM | POA: Diagnosis not present

## 2022-04-10 DIAGNOSIS — C61 Malignant neoplasm of prostate: Secondary | ICD-10-CM | POA: Diagnosis not present

## 2022-04-10 DIAGNOSIS — N5201 Erectile dysfunction due to arterial insufficiency: Secondary | ICD-10-CM | POA: Diagnosis not present

## 2022-10-10 DIAGNOSIS — C61 Malignant neoplasm of prostate: Secondary | ICD-10-CM | POA: Diagnosis not present

## 2022-10-17 DIAGNOSIS — C61 Malignant neoplasm of prostate: Secondary | ICD-10-CM | POA: Diagnosis not present

## 2022-10-17 DIAGNOSIS — C775 Secondary and unspecified malignant neoplasm of intrapelvic lymph nodes: Secondary | ICD-10-CM | POA: Diagnosis not present

## 2022-12-18 DIAGNOSIS — Z0000 Encounter for general adult medical examination without abnormal findings: Secondary | ICD-10-CM | POA: Diagnosis not present

## 2022-12-18 DIAGNOSIS — Z13220 Encounter for screening for lipoid disorders: Secondary | ICD-10-CM | POA: Diagnosis not present

## 2022-12-18 DIAGNOSIS — E782 Mixed hyperlipidemia: Secondary | ICD-10-CM | POA: Diagnosis not present

## 2022-12-18 DIAGNOSIS — M25572 Pain in left ankle and joints of left foot: Secondary | ICD-10-CM | POA: Diagnosis not present

## 2023-05-23 ENCOUNTER — Telehealth: Payer: Self-pay | Admitting: Genetic Counselor

## 2023-05-23 NOTE — Telephone Encounter (Signed)
 Patient Scheduled appts. Patient is aware of all appt details.

## 2023-07-12 ENCOUNTER — Inpatient Hospital Stay: Payer: BC Managed Care – PPO | Attending: Genetic Counselor | Admitting: Genetic Counselor

## 2023-07-12 ENCOUNTER — Inpatient Hospital Stay: Payer: BC Managed Care – PPO

## 2023-07-12 ENCOUNTER — Encounter: Payer: Self-pay | Admitting: Genetic Counselor

## 2023-07-12 DIAGNOSIS — Z800 Family history of malignant neoplasm of digestive organs: Secondary | ICD-10-CM | POA: Diagnosis not present

## 2023-07-12 DIAGNOSIS — C61 Malignant neoplasm of prostate: Secondary | ICD-10-CM | POA: Diagnosis not present

## 2023-07-12 DIAGNOSIS — Z8042 Family history of malignant neoplasm of prostate: Secondary | ICD-10-CM

## 2023-07-12 DIAGNOSIS — C775 Secondary and unspecified malignant neoplasm of intrapelvic lymph nodes: Secondary | ICD-10-CM | POA: Diagnosis not present

## 2023-07-12 LAB — GENETIC SCREENING ORDER

## 2023-07-12 NOTE — Progress Notes (Signed)
 REFERRING PROVIDER: Renda Glance, MD   PRIMARY PROVIDER:  Frederik Charleston, MD  PRIMARY REASON FOR VISIT:  1. 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 Family history of malignant neoplasm of prostate   3. Family history of malignant neoplasm of gastrointestinal tract     HISTORY OF PRESENT ILLNESS:   Barry Nash, a 63 y.o. male, was seen for a [place] cancer genetics consultation at the request of Dr. Frederik due to a personal and family history of cancer.  Barry Nash presents to clinic today to discuss the possibility of a hereditary predisposition to cancer, genetic testing, and to further clarify his future cancer risks, as well as potential cancer risks for family members.   In 2019, at the age of 5, Barry Nash was diagnosed with prostate cancer. He was treated with a prostatectomy and adjuvant radiation. He developed a biochemical recurrence in 2021, isolated lymph node metastasis. He received directed radiation therapy and is doing well currently.   RELEVANT MEDICAL HISTORY:  Colonoscopy: last completed around 2021, no polyps.  PSA monitored by urologist, plan for recheck in 6 months  Dermatology yearly   Past Medical History:  Diagnosis Date   Prostate cancer [facility])     Past Surgical History:  Procedure Laterality Date   LYMPHADENECTOMY Bilateral 06/24/2017   Procedure: LYMPHADENECTOMY;  Surgeon: Renda Glance, MD;  Location: [facility];  Service: Urology;  Laterality: Bilateral;   PROSTATE BIOPSY     ROBOT ASSISTED LAPAROSCOPIC RADICAL PROSTATECTOMY N/A 06/24/2017   Procedure: XI ROBOTIC ASSISTED LAPAROSCOPIC RADICAL PROSTATECTOMY LEVEL 2;  Surgeon: Renda Glance, MD;  Location: [facility];  Service: Urology;  Laterality: N/A;   TONSILLECTOMY      Social History   Socioeconomic History   Marital status: Married    Spouse name: Not on file   Number of children: Not on file   Years of education: Not on file   Highest education level: Not on file   Occupational History    Comment: full time  Tobacco Use   Smoking status: Never   Smokeless tobacco: Never  Vaping Use   Vaping status: Never Used  Substance and Sexual Activity   Alcohol use: Yes    Comment: occasionally   Drug use: No   Sexual activity: Yes  Other Topics Concern   Not on file  Social History Narrative   Resides in [place]. Two children: son (29) and daughter (8).    Social Drivers of Corporate Investment Banker Strain: Not on file  Food Insecurity: Not on file  Transportation Needs: Not on file  Physical Activity: Not on file  Stress: Not on file  Social Connections: Not on file     FAMILY HISTORY:  We obtained a detailed, 4-generation family history.  Significant diagnoses are listed below: Family History  Problem Relation Age of Onset   Diabetes Father    Heart disease Father    Benign prostatic hyperplasia Father    Prostate cancer Father 22   Pancreatic cancer Paternal Aunt    Prostate cancer Paternal Uncle    Prostate cancer Cousin    Breast cancer Neg Hx    Colon cancer Neg Hx     Barry Nash is unaware of previous family history of genetic testing for hereditary cancer risks. There is no reported Ashkenazi Jewish ancestry.   Barry Nash reports that his father was diagnosed with prostate cancer at age 29, living at age 20. He reports his paternal uncle  was diagnosed with prostate cancer at age 72, living at 65. He reports a paternal aunt diagnosed with pancreatic cancer at age 16, living at 87. He reports his paternal first cousin was diagnosed with prostate cancer at age 6, living at age 41.     GENETIC COUNSELING ASSESSMENT: Barry Nash is a 63 y.o. male with a personal and family history of cancer which suggestive of an inherited predisposition to cancer. We, therefore, discussed and recommended the following at today's visit.   DISCUSSION: We discussed that, in general, most cancer is not inherited in families, but instead is sporadic  or familial. Sporadic cancers occur by chance and typically happen at older ages (>50 years) as this type of cancer is caused by genetic changes acquired during an individual's lifetime. Some families have more cancers than would be expected by chance; however, the ages or types of cancer are not consistent with a known genetic mutation or known genetic mutations have been ruled out. This type of familial cancer is thought to be due to a combination of multiple genetic, environmental, hormonal, and lifestyle factors. While this combination of factors likely increases the risk of cancer, the exact source of this risk is not currently identifiable or testable.  We discussed that 5 - 10% of cancer is hereditary. We discussed that testing is beneficial for several reasons including knowing how to follow individuals after completing their treatment, identifying whether potential treatment options such as PARP inhibitors would be beneficial, identifying other screening and preventative options and to understand if other family members could be at risk for cancer and allow them to undergo genetic testing.   We reviewed the characteristics, features and inheritance patterns of hereditary cancer syndromes. We also discussed genetic testing, including the appropriate family members to test, the process of testing, insurance coverage and turn-around-time for results. We discussed the implications of a negative, positive, carrier and/or variant of uncertain significant result. Barry Nash  was offered a common hereditary cancer panel (36+ genes) and an expanded pan-cancer panel (70+ genes). Barry Nash was informed of the benefits and limitations of each panel, including that expanded pan-cancer panels contain genes that do not have clear management guidelines at this point in time.  We also discussed that as the number of genes included on a panel increases, the chances of variants of uncertain significance increases. Mr.  Nash decided to pursue genetic testing for the CancerNext + RNAinsight 39 gene panel. The [facility] CancerNext+RNAinsight Panel includes sequencing, rearrangement analysis, and RNA analysis for the following 39 genes: APC, ATM, BAP1, BARD1, BMPR1A, BRCA1, BRCA2, BRIP1, CDH1, CDKN2A, CHEK2, FH, FLCN, MET, MLH1, MSH2, MSH6, MUTYH, NF1, NTHL1, PALB2, PMS2, PTEN, RAD51C, RAD51D, SMAD4, STK11, TP53, TSC1, TSC2, and VHL (sequencing and deletion/duplication); AXIN2, HOXB13, MBD4, MSH3, POLD1 and POLE (sequencing only); EPCAM and GREM1 (deletion/duplication only).  Based on Barry Nash's personal and family history of cancer, he meets medical criteria for genetic testing. Despite that he meets criteria, he may still have an out of pocket cost. We discussed that if his out of pocket cost for testing is over $100, the laboratory will call and confirm whether he wants to proceed with testing.  If the out of pocket cost of testing is less than $100 he will be billed by the genetic testing laboratory.   We discussed that some people do not want to undergo genetic testing due to fear of genetic discrimination.  The Genetic Information Nondiscrimination Act (GINA) was signed into federal law in 2008.  GINA prohibits health insurers and most employers from discriminating against individuals based on genetic information (including the results of genetic tests and family history information). According to GINA, health insurance companies cannot consider genetic information to be a preexisting condition, nor can they use it to make decisions regarding coverage or rates. GINA also makes it illegal for most employers to use genetic information in making decisions about hiring, firing, promotion, or terms of employment. It is important to note that GINA does not offer protections for life insurance, disability insurance, or long-term care insurance. GINA does not apply to those in the [employer], those who work for companies with less than  15 employees, and new life insurance or long-term disability insurance policies.  Health status due to a cancer diagnosis is not protected under GINA. More information about GINA can be found by visiting eliteclients.be.  PLAN: After considering the risks, benefits, and limitations, Barry Nash provided informed consent to pursue genetic testing and the blood sample was sent to [facility] for analysis of the CancerNext +RNAinsight panel. Results should be available within approximately 2-3 weeks' time, at which point they will be disclosed by telephone to Barry Nash, as will any additional recommendations warranted by these results. Barry Nash will receive a summary of his genetic counseling visit and a copy of his results once available. This information will also be available in Epic.   Lastly, we encouraged Barry Nash to remain in contact with cancer genetics annually so that we can continuously update the family history and inform him of any changes in cancer genetics and testing that may be of benefit for this family.   Barry Nash questions were answered to his satisfaction today. Our contact information was provided should additional questions or concerns arise. Thank you for the referral and allowing us  to share in the care of your patient.   Burnard Ogren, MS, [facility] Licensed, [email or web address] phone: [phone]  50 minutes were spent on the date of the encounter in service to the patient including preparation, face-to-face consultation, documentation and care coordination.  The patient was seen alone. Drs. Lanny Stalls, and/or Gudena were available for questions, if needed..    _______________________________________________________________________ For Office Staff:  Number of people involved in session: 1 Was an Intern/ student involved with case: yes, [employer] Intern, Barry Teague participated in this visit with direct supervision by  me.

## 2023-07-29 ENCOUNTER — Ambulatory Visit: Payer: Self-pay | Admitting: Genetic Counselor

## 2023-07-29 ENCOUNTER — Telehealth: Payer: Self-pay | Admitting: Genetic Counselor

## 2023-07-29 DIAGNOSIS — Z1379 Encounter for other screening for genetic and chromosomal anomalies: Secondary | ICD-10-CM | POA: Insufficient documentation

## 2023-07-29 NOTE — Progress Notes (Signed)
 HPI:  Barry Nash was previously seen in the [facility] clinic due to a personal and family history of cancer and concerns regarding a hereditary predisposition to cancer. Please refer to our prior cancer genetics clinic note for more information regarding our discussion, assessment and recommendations, at the time. Barry Nash recent genetic test results were disclosed to him, as were recommendations warranted by these results. These results and recommendations are discussed in more detail below.  CANCER HISTORY:  In 2019, at the age of 12, Barry Nash was diagnosed with prostate cancer. He was treated with a prostatectomy and adjuvant radiation. He developed a biochemical recurrence in 2021, isolated lymph node metastasis. He received directed radiation therapy and is doing well currently.     FAMILY HISTORY:  We obtained a detailed, 4-generation family history.  Significant diagnoses are listed below: Family History  Problem Relation Age of Onset   Diabetes Father    Heart disease Father    Benign prostatic hyperplasia Father    Prostate cancer Father 80   Pancreatic cancer Paternal Aunt    Prostate cancer Paternal Uncle    Prostate cancer Cousin    Breast cancer Neg Hx    Colon cancer Neg Hx     Barry Nash is unaware of previous family history of genetic testing for hereditary cancer risks. There is no reported Ashkenazi Jewish ancestry.    Barry Nash reports that his father was diagnosed with prostate cancer at age 37, living at age 73. He reports his paternal uncle was diagnosed with prostate cancer at age 40, living at 52. He reports a paternal aunt diagnosed with pancreatic cancer at age 85, living at 8. He reports his paternal first cousin was diagnosed with prostate cancer at age 69, living at age 37.      GENETIC TEST RESULTS: Genetic testing reported out on 07/22/23 through the [facility] +RNAinsight panel found no pathogenic mutations. The [facility]  CancerNext+RNAinsight Panel includes sequencing, rearrangement analysis, and RNA analysis for the following 39 genes: APC, ATM, BAP1, BARD1, BMPR1A, BRCA1, BRCA2, BRIP1, CDH1, CDKN2A, CHEK2, FH, FLCN, MET, MLH1, MSH2, MSH6, MUTYH, NF1, NTHL1, PALB2, PMS2, PTEN, RAD51C, RAD51D, SMAD4, STK11, TP53, TSC1, TSC2, and VHL (sequencing and deletion/duplication); AXIN2, HOXB13, MBD4, MSH3, POLD1 and POLE (sequencing only); EPCAM and GREM1 (deletion/duplication only). The test report has been scanned into EPIC and is located under the Molecular Pathology section of the Results Review tab.  A portion of the result report is included below for reference.     We discussed with Barry Nash that because current genetic testing is not perfect, it is possible there may be a gene mutation in one of these genes that current testing cannot detect, but that chance is small.  We also discussed, that there could be another gene that has not yet been discovered, or that we have not yet tested, that is responsible for the cancer diagnoses in the family. It is also possible there is a hereditary cause for the cancer in the family that Barry Nash did not inherit and therefore was not identified in his testing.  Therefore, it is important to remain in touch with cancer genetics in the future so that we can continue to offer Barry Nash the most up to date genetic testing.   Genetic testing did identify a variant of uncertain significance (VUS) was identified in the POLE gene called p.R1324C (c.[id number]).  At this time, it is unknown if this variant is associated with increased  cancer risk or if this is a normal finding, but most variants such as this get reclassified to being inconsequential. It should not be used to make medical management decisions. With time, we suspect the lab will determine the significance of this variant, if any. If we do learn more about it, we will try to contact Barry Nash to discuss it further. However, it is  important to stay in touch with us  periodically and keep the address and phone number up to date.  ADDITIONAL GENETIC TESTING: We discussed with Barry Nash that there are other genes that are associated with increased cancer risk that can be analyzed. Should Barry Nash wish to pursue additional genetic testing, we are happy to discuss and coordinate this testing, at any time.    CANCER SCREENING RECOMMENDATIONS: Barry Nash test result is considered negative (normal).  This means that we have not identified a hereditary cause for his personal and family history of cancer at this time. Most cancers happen by chance and this negative test suggests that his personal and family history of cancer may fall into this category.    Possible reasons for Barry Nash's negative genetic test include:  1. There may be a gene mutation in one of these genes that current testing methods cannot detect but that chance is small.  2. There could be another gene that has not yet been discovered, or that we have not yet tested, that is responsible for the cancer diagnoses in the family.  3.  There may be no hereditary risk for cancer in the family. The cancers in Barry Nash and/or his family may be sporadic/familial or due to other genetic and environmental factors. 4. It is also possible there is a hereditary cause for the cancer in the family that Barry Nash did not inherit.  Therefore, it is recommended he continue to follow the cancer management and screening guidelines provided by his oncology and primary healthcare provider. An individual's cancer risk and medical management are not determined by genetic test results alone. Overall cancer risk assessment incorporates additional factors, including personal medical history, family history, and any available genetic information that may result in a personalized plan for cancer prevention and surveillance  Given Barry Nash's personal and family histories, we must interpret  these negative results with some caution.  Families with features suggestive of hereditary risk for cancer tend to have multiple family members with cancer, diagnoses in multiple generations and diagnoses before the age of 40. Barry Nash family exhibits some of these features. Thus, this result may simply reflect our current inability to detect all mutations within these genes or there may be a different gene that has not yet been discovered or tested.   An individual's cancer risk and medical management are not determined by genetic test results alone. Overall cancer risk assessment incorporates additional factors, including personal medical history, family history, and any available genetic information that may result in a personalized plan for cancer prevention and surveillance.  RECOMMENDATIONS FOR FAMILY MEMBERS:  Individuals in this family might be at some increased risk of developing cancer, over the general population risk, simply due to the family history of cancer.  We encourage family members to discuss the family history of cancer with their own healthcare providers. We recommended women in this family have a yearly mammogram beginning at age 63, or 65 years younger than the earliest onset of cancer, an annual clinical breast exam, and perform monthly breast self-exams. Women in this family should  also have a gynecological exam as recommended by their primary provider. All family members should be referred for colonoscopy starting at age 59, or 44 years younger than the earliest onset of cancer.  FOLLOW-UP: Lastly, we discussed with Mr. Broomhead that cancer genetics is a rapidly advancing field and it is possible that new genetic tests will be appropriate for him and/or his family members in the future. We encouraged him to remain in contact with cancer genetics on an annual basis so we can update his personal and family histories and let him know of advances in cancer genetics that may benefit  this family.   Our contact number was provided. Mr. Penix questions were answered to his satisfaction, and he knows he is welcome to call us  at anytime with additional questions or concerns.   Jobie Mulders, MS, [facility] Licensed, [email or web address] [phone]

## 2023-07-29 NOTE — Telephone Encounter (Signed)
 I spoke to Barry Nash to review results of genetic testing. he had genetic testing with [facility]'s 39 gene CancerNext +RNAinsight. 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 We discussed that we do not use VUS to make management decisions or identify at risk family members. Discussed that we do not know why he has prostate cancer or why there is cancer in the family. It could be due to a different gene that we are not testing, or maybe our current technology may not be able to pick something up.  It will be important for him to keep in contact with genetics to keep up with whether additional testing may be needed. We will contact him if new information is learned about the VUS.   Please see counseling note for further detail on this result.

## 2023-11-15 ENCOUNTER — Telehealth: Payer: Self-pay | Admitting: Genetic Counselor

## 2023-11-15 NOTE — Telephone Encounter (Signed)
 Spoke to Barry Nash about a bill he received from [place] for genetic testing, reports it was for $1200 and that he was not contacted by [place] regarding any out of pocket cost in advance. I will follow up with the lab regarding what happened and options.

## 2023-11-18 DIAGNOSIS — C61 Malignant neoplasm of prostate: Secondary | ICD-10-CM | POA: Diagnosis not present

## 2023-11-22 NOTE — Telephone Encounter (Signed)
 LVM with Greg's wife. Will send more information via myChart regarding billing question.
